# Patient Record
Sex: Male | Born: 1960 | Race: White | Hispanic: No | Marital: Married | Smoking: Current every day smoker
Health system: Southern US, Community
[De-identification: ages and names within clinical notes are randomized; demographics above are authoritative.]

## PROBLEM LIST (undated history)

## (undated) DIAGNOSIS — I499 Cardiac arrhythmia, unspecified: Secondary | ICD-10-CM

## (undated) DIAGNOSIS — F32A Depression, unspecified: Secondary | ICD-10-CM

## (undated) DIAGNOSIS — F329 Major depressive disorder, single episode, unspecified: Secondary | ICD-10-CM

## (undated) DIAGNOSIS — N400 Enlarged prostate without lower urinary tract symptoms: Secondary | ICD-10-CM

## (undated) DIAGNOSIS — J189 Pneumonia, unspecified organism: Secondary | ICD-10-CM

## (undated) DIAGNOSIS — J40 Bronchitis, not specified as acute or chronic: Secondary | ICD-10-CM

## (undated) DIAGNOSIS — G459 Transient cerebral ischemic attack, unspecified: Secondary | ICD-10-CM

## (undated) DIAGNOSIS — G8929 Other chronic pain: Secondary | ICD-10-CM

## (undated) DIAGNOSIS — M797 Fibromyalgia: Secondary | ICD-10-CM

## (undated) DIAGNOSIS — G4730 Sleep apnea, unspecified: Secondary | ICD-10-CM

## (undated) DIAGNOSIS — J449 Chronic obstructive pulmonary disease, unspecified: Secondary | ICD-10-CM

## (undated) DIAGNOSIS — E079 Disorder of thyroid, unspecified: Secondary | ICD-10-CM

## (undated) DIAGNOSIS — J45909 Unspecified asthma, uncomplicated: Secondary | ICD-10-CM

## (undated) DIAGNOSIS — M549 Dorsalgia, unspecified: Secondary | ICD-10-CM

## (undated) DIAGNOSIS — R51 Headache: Secondary | ICD-10-CM

## (undated) DIAGNOSIS — K219 Gastro-esophageal reflux disease without esophagitis: Secondary | ICD-10-CM

## (undated) DIAGNOSIS — I10 Essential (primary) hypertension: Secondary | ICD-10-CM

## (undated) DIAGNOSIS — R0602 Shortness of breath: Secondary | ICD-10-CM

## (undated) DIAGNOSIS — E039 Hypothyroidism, unspecified: Secondary | ICD-10-CM

## (undated) DIAGNOSIS — I639 Cerebral infarction, unspecified: Secondary | ICD-10-CM

## (undated) DIAGNOSIS — M1990 Unspecified osteoarthritis, unspecified site: Secondary | ICD-10-CM

## (undated) DIAGNOSIS — N200 Calculus of kidney: Secondary | ICD-10-CM

## (undated) HISTORY — PX: PATENT FORAMEN OVALE CLOSURE: SHX2181

## (undated) HISTORY — PX: RADIAL HEAD EXCISION: SHX757

## (undated) HISTORY — PX: FACIAL RECONSTRUCTION SURGERY: SHX631

## (undated) HISTORY — PX: CHOLECYSTECTOMY: SHX55

## (undated) HISTORY — PX: DUPUYTREN / PALMAR FASCIOTOMY: SUR601

---

## 2011-07-02 ENCOUNTER — Encounter: Payer: Self-pay | Admitting: Student

## 2011-07-02 ENCOUNTER — Emergency Department (INDEPENDENT_AMBULATORY_CARE_PROVIDER_SITE_OTHER): Payer: Managed Care, Other (non HMO)

## 2011-07-02 ENCOUNTER — Emergency Department (HOSPITAL_BASED_OUTPATIENT_CLINIC_OR_DEPARTMENT_OTHER)
Admission: EM | Admit: 2011-07-02 | Discharge: 2011-07-02 | Disposition: A | Discharge status: Home / Self Care | Payer: Managed Care, Other (non HMO) | Attending: Emergency Medicine | Admitting: Emergency Medicine

## 2011-07-02 DIAGNOSIS — IMO0002 Reserved for concepts with insufficient information to code with codable children: Secondary | ICD-10-CM | POA: Insufficient documentation

## 2011-07-02 DIAGNOSIS — G8929 Other chronic pain: Secondary | ICD-10-CM | POA: Insufficient documentation

## 2011-07-02 DIAGNOSIS — Z8679 Personal history of other diseases of the circulatory system: Secondary | ICD-10-CM | POA: Insufficient documentation

## 2011-07-02 DIAGNOSIS — N508 Other specified disorders of male genital organs: Secondary | ICD-10-CM

## 2011-07-02 DIAGNOSIS — IMO0001 Reserved for inherently not codable concepts without codable children: Secondary | ICD-10-CM | POA: Insufficient documentation

## 2011-07-02 DIAGNOSIS — D1779 Benign lipomatous neoplasm of other sites: Secondary | ICD-10-CM | POA: Insufficient documentation

## 2011-07-02 DIAGNOSIS — N509 Disorder of male genital organs, unspecified: Secondary | ICD-10-CM | POA: Insufficient documentation

## 2011-07-02 DIAGNOSIS — X500XXA Overexertion from strenuous movement or load, initial encounter: Secondary | ICD-10-CM | POA: Insufficient documentation

## 2011-07-02 DIAGNOSIS — J45909 Unspecified asthma, uncomplicated: Secondary | ICD-10-CM | POA: Insufficient documentation

## 2011-07-02 DIAGNOSIS — R1032 Left lower quadrant pain: Secondary | ICD-10-CM | POA: Insufficient documentation

## 2011-07-02 DIAGNOSIS — R109 Unspecified abdominal pain: Secondary | ICD-10-CM

## 2011-07-02 DIAGNOSIS — K219 Gastro-esophageal reflux disease without esophagitis: Secondary | ICD-10-CM | POA: Insufficient documentation

## 2011-07-02 DIAGNOSIS — E119 Type 2 diabetes mellitus without complications: Secondary | ICD-10-CM | POA: Insufficient documentation

## 2011-07-02 DIAGNOSIS — S76219A Strain of adductor muscle, fascia and tendon of unspecified thigh, initial encounter: Secondary | ICD-10-CM

## 2011-07-02 HISTORY — DX: Cerebral infarction, unspecified: I63.9

## 2011-07-02 HISTORY — DX: Transient cerebral ischemic attack, unspecified: G45.9

## 2011-07-02 HISTORY — DX: Fibromyalgia: M79.7

## 2011-07-02 HISTORY — DX: Gastro-esophageal reflux disease without esophagitis: K21.9

## 2011-07-02 HISTORY — DX: Essential (primary) hypertension: I10

## 2011-07-02 HISTORY — DX: Benign prostatic hyperplasia without lower urinary tract symptoms: N40.0

## 2011-07-02 HISTORY — DX: Other chronic pain: G89.29

## 2011-07-02 HISTORY — DX: Dorsalgia, unspecified: M54.9

## 2011-07-02 LAB — URINALYSIS, ROUTINE W REFLEX MICROSCOPIC
Glucose, UA: NEGATIVE mg/dL
Ketones, ur: 15 mg/dL — AB
Leukocytes, UA: NEGATIVE
Nitrite: NEGATIVE
Protein, ur: NEGATIVE mg/dL
pH: 5 (ref 5.0–8.0)

## 2011-07-02 NOTE — ED Notes (Signed)
MD at bedside. 

## 2011-07-02 NOTE — ED Notes (Signed)
Pt in with c/o left lower abd pain radiating from groin and testicle to LLQ. Reports swelling to left groin, nausea, and burning sensation from left testicle to left groin.

## 2011-07-02 NOTE — ED Provider Notes (Signed)
History     CSN: [id number] Arrival date & time: 07/02/2011 11:46 AM  Chief Complaint  Patient presents with  . Abdominal Pain   HPI Comments: 3 days of left groin pain after lifting a heavy object on Friday. Pain immediately started after lifting and has been persistent and worsening. Pain radiates into his left testicle and left lower abdomen. Symptoms are associated with nausea but no vomiting no fever no urinary symptoms. Patient takes morphine at home for chronic back pain which she says is not affecting his groin pain at all.  No bowel movement x 3 days but normally regular.  No fever, chills, bowel or bladder incontinence.  No weakness, numbness, tingling in extremities.  No upper abdominal pain.  No chest pain, SOB, cough.  The history is provided by the patient.    Past Medical History  Diagnosis Date  . Asthma   . Diabetes mellitus   . Hypertension   . Stroke   . TIA (transient ischemic attack)   . Chronic back pain   . Fibromyalgia   . BPH (benign prostatic hyperplasia)   . GERD (gastroesophageal reflux disease)   . Low testosterone     Past Surgical History  Procedure Date  . Cholecystectomy   . Facial reconstruction surgery   . Patent foramen ovale closure   . Dupuytren / palmar fasciotomy     History reviewed. No pertinent family history.  History  Substance Use Topics  . Smoking status: Current Everyday Smoker  . Smokeless tobacco: Never Used  . Alcohol Use: No      Review of Systems  Constitutional: Negative for activity change and appetite change.  HENT: Negative for congestion and rhinorrhea.   Respiratory: Negative for cough, chest tightness and shortness of breath.   Cardiovascular: Negative for chest pain.  Gastrointestinal: Positive for nausea and abdominal pain. Negative for vomiting.  Genitourinary: Positive for scrotal swelling and testicular pain. Negative for dysuria, hematuria, flank pain and difficulty urinating.  Musculoskeletal:  Positive for back pain.  Neurological: Negative for dizziness, weakness and headaches.    Physical Exam  BP 144/93  Pulse 99  Temp(Src) 98.8 F (37.1 C) (Axillary)  Resp 20  Wt 200 lb (90.719 kg)  SpO2 99%  Physical Exam  Constitutional: He is oriented to person, place, and time. He appears well-developed and well-nourished. No distress.  HENT:  Head: Normocephalic and atraumatic.  Mouth/Throat: No oropharyngeal exudate.  Eyes: Conjunctivae are normal. Pupils are equal, round, and reactive to light.  Neck: Normal range of motion.  Cardiovascular: Normal rate, regular rhythm and normal heart sounds.   Pulmonary/Chest: Effort normal and breath sounds normal. No respiratory distress.  Abdominal: Soft. There is no tenderness. There is no rebound and no guarding.       LLQ soft and nontender  Genitourinary: Penis normal.       TTP L testicle and epididymis.  No appreciable hernias but pain on exam.  TTP L inguinal region without obvious hernia.  Musculoskeletal: Normal range of motion.  Neurological: He is alert and oriented to person, place, and time. No cranial nerve deficit.  Skin: Skin is warm.    ED Course  Procedures  MDM Groin and testicle pain after lifting associated with nausea.  Concern for hernia, though non appreciable on exam. Will ultrasound testicle to evaluate, UA.  Normal testicular ultrasound, no hernias seen on or CT.  Patient given groin strain instructions and continue to take chronic pain meds.  Results for orders  placed during the hospital encounter of 07/02/11  URINALYSIS, ROUTINE W REFLEX MICROSCOPIC      Component Value Range   Color, Urine AMBER (*) YELLOW    Appearance CLEAR  CLEAR    Specific Gravity, Urine 1.023  1.005 - 1.030    pH 5.0  5.0 - 8.0    Glucose, UA NEGATIVE  NEGATIVE (mg/dL)   Hgb urine dipstick NEGATIVE  NEGATIVE    Bilirubin Urine SMALL (*) NEGATIVE    Ketones, ur 15 (*) NEGATIVE (mg/dL)   Protein, ur NEGATIVE  NEGATIVE  (mg/dL)   Urobilinogen, UA 0.2  0.0 - 1.0 (mg/dL)   Nitrite NEGATIVE  NEGATIVE    Leukocytes, UA NEGATIVE  NEGATIVE    Ct Pelvis Wo Contrast  07/02/2011  *RADIOLOGY REPORT*  Clinical Data:  Left groin pain.  Query hernia.  CT PELVIS WITHOUT CONTRAST  Technique:  Multidetector CT imaging of the pelvis was performed following the standard protocol without intravenous contrast.  Comparison:  None.  Findings:  Postoperative findings noted along the right iliac crest.  No groin hernia or significant abnormality along the spermatic cords identified.  No pelvic adenopathy is noted.  The appendix appears normal.  The pubic symphysis appears unremarkable.  No hip effusion is observed.  Incidental note is made of a lipoma in the left biceps femoris muscle proximally, measuring approximately 3.8 x 1.3 x 1.4 cm.  A small focus of popcorn type calcification without significant soft tissue lesion measuring 1.3 x 0.9 x 0.7 cm is present above the right sided urinary bladder on image 31 of series 3.  IMPRESSION:  1.  No hernia identified. 2.  Postoperative findings along the right iliac crest. 3.  Small focus of popcorn type calcification in the mesentery above the right sided urinary bladder probably represents a calcified lymph node related to remote infection, or a focus of prior fat necrosis in this vicinity.  Given the morphology, character, and location of this structure, a neoplastic cause is considered highly unlikely. 4.  Left biceps femoris muscle lipoma.  Original Report Authenticated By: Dellia Cloud, M.D.   US Scrotum  07/02/2011  *RADIOLOGY REPORT*  Clinical Data:  Left testicle and groin pain for 3 days status post heavy lifting.  SCROTAL ULTRASOUND DOPPLER ULTRASOUND OF THE TESTICLES  Technique: Complete ultrasound examination of the testicles, epididymis, and other scrotal structures was performed.  Color and spectral Doppler ultrasound were also utilized to evaluate blood flow to the testicles.   Comparison:  None  Findings:  Right testis:  The right testis is normal in echogenicity.  It is normal in size, measuring 4.1 x 1.9 x 2.7 cm.  There is no mass or evidence of microlithiasis.  Normal color Doppler flow is identified to the right testis.  Arterial and venous waveforms are seen in the right testis.  Left testis:  The left testis is normal in echogenicity.  The left testis is normal in size, measuring 3.7 x 1.7 x 2.0 cm.  There is no mass or evidence of microlithiasis.  Normal and symmetric color Doppler flow, compared to the right testis, is identified. Arterial venous venous waveforms are seen.  Right epididymis:  Normal in size and appearance.  Left epididymis:  Contains a 5 mm cyst versus spermatocele.  Hydocele:  present/absent.  Varicocele:  present/absent.  Pulsed Doppler interrogation of both testes demonstrates low resistance flow bilaterally.  No evidence of hernia in the groin bilaterally.  IMPRESSION: 1. Normal appearance of both testes.  No evidence of testicular mass or torsion.  2.  5 mm spermatocele or epididymal cyst on the left.  Original Report Authenticated By: Britta Mccreedy, M.D.   [place] Art/ven Flow Abd Pelv Doppler  07/02/2011  *RADIOLOGY REPORT*  Clinical Data:  Left testicle and groin pain for 3 days status post heavy lifting.  SCROTAL ULTRASOUND DOPPLER ULTRASOUND OF THE TESTICLES  Technique: Complete ultrasound examination of the testicles, epididymis, and other scrotal structures was performed.  Color and spectral Doppler ultrasound were also utilized to evaluate blood flow to the testicles.  Comparison:  None  Findings:  Right testis:  The right testis is normal in echogenicity.  It is normal in size, measuring 4.1 x 1.9 x 2.7 cm.  There is no mass or evidence of microlithiasis.  Normal color Doppler flow is identified to the right testis.  Arterial and venous waveforms are seen in the right testis.  Left testis:  The left testis is normal in echogenicity.  The left testis is  normal in size, measuring 3.7 x 1.7 x 2.0 cm.  There is no mass or evidence of microlithiasis.  Normal and symmetric color Doppler flow, compared to the right testis, is identified. Arterial venous venous waveforms are seen.  Right epididymis:  Normal in size and appearance.  Left epididymis:  Contains a 5 mm cyst versus spermatocele.  Hydocele:  present/absent.  Varicocele:  present/absent.  Pulsed Doppler interrogation of both testes demonstrates low resistance flow bilaterally.  No evidence of hernia in the groin bilaterally.  IMPRESSION: 1. Normal appearance of both testes.  No evidence of testicular mass or torsion.  2.  5 mm spermatocele or epididymal cyst on the left.  Original Report Authenticated By: Britta Mccreedy, M.D.        Glynn Octave, MD 07/02/11 [phone]

## 2011-08-13 ENCOUNTER — Encounter (HOSPITAL_BASED_OUTPATIENT_CLINIC_OR_DEPARTMENT_OTHER): Payer: Self-pay | Admitting: *Deleted

## 2011-08-13 ENCOUNTER — Emergency Department (HOSPITAL_BASED_OUTPATIENT_CLINIC_OR_DEPARTMENT_OTHER)
Admission: EM | Admit: 2011-08-13 | Discharge: 2011-08-13 | Disposition: A | Discharge status: Home / Self Care | Payer: Managed Care, Other (non HMO) | Attending: Emergency Medicine | Admitting: Emergency Medicine

## 2011-08-13 DIAGNOSIS — E119 Type 2 diabetes mellitus without complications: Secondary | ICD-10-CM | POA: Insufficient documentation

## 2011-08-13 DIAGNOSIS — K0889 Other specified disorders of teeth and supporting structures: Secondary | ICD-10-CM

## 2011-08-13 DIAGNOSIS — R220 Localized swelling, mass and lump, head: Secondary | ICD-10-CM | POA: Insufficient documentation

## 2011-08-13 DIAGNOSIS — G8929 Other chronic pain: Secondary | ICD-10-CM | POA: Insufficient documentation

## 2011-08-13 DIAGNOSIS — K219 Gastro-esophageal reflux disease without esophagitis: Secondary | ICD-10-CM | POA: Insufficient documentation

## 2011-08-13 DIAGNOSIS — R221 Localized swelling, mass and lump, neck: Secondary | ICD-10-CM | POA: Insufficient documentation

## 2011-08-13 DIAGNOSIS — R599 Enlarged lymph nodes, unspecified: Secondary | ICD-10-CM | POA: Insufficient documentation

## 2011-08-13 DIAGNOSIS — I10 Essential (primary) hypertension: Secondary | ICD-10-CM | POA: Insufficient documentation

## 2011-08-13 DIAGNOSIS — Z8679 Personal history of other diseases of the circulatory system: Secondary | ICD-10-CM | POA: Insufficient documentation

## 2011-08-13 DIAGNOSIS — K089 Disorder of teeth and supporting structures, unspecified: Secondary | ICD-10-CM | POA: Insufficient documentation

## 2011-08-13 DIAGNOSIS — IMO0001 Reserved for inherently not codable concepts without codable children: Secondary | ICD-10-CM | POA: Insufficient documentation

## 2011-08-13 MED ORDER — CLINDAMYCIN HCL 150 MG PO CAPS: 150.0000 mg | ORAL_CAPSULE | Freq: Four times a day (QID) | ORAL | Status: AC
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
## 2011-08-13 NOTE — ED Provider Notes (Signed)
History     CSN: [id number] Arrival date & time: 08/13/2011  4:50 AM  Chief Complaint  Patient presents with  . Dental Problem    (Consider location/radiation/quality/duration/timing/severity/associated sxs/prior treatment) Patient is a 50 y.o. male presenting with tooth pain. The history is provided by the patient.  Dental PainThe primary symptoms include mouth pain and dental injury. Primary symptoms do not include oral bleeding, headaches, fever, shortness of breath or angioedema. The symptoms began 1 to 2 hours ago. The symptoms are unchanged. The symptoms occur constantly.  Additional symptoms include: gum tenderness and facial swelling. Additional symptoms do not include: trismus, trouble swallowing and pain with swallowing.   HX OF FREQUENT AND RECURRENT DENTAL PROBLEMS. ALLERGIC TO PCN, CLINDA WORKS BEST. DOES NOT WANT PAIN MEDS SINCE FOLLOWED BY PAIN MANAGEMENT.   Past Medical History  Diagnosis Date  . Diabetes mellitus   . Hypertension   . Stroke   . TIA (transient ischemic attack)   . Chronic back pain   . Fibromyalgia   . BPH (benign prostatic hyperplasia)   . GERD (gastroesophageal reflux disease)   . Low testosterone     Past Surgical History  Procedure Date  . Cholecystectomy   . Facial reconstruction surgery   . Patent foramen ovale closure   . Dupuytren / palmar fasciotomy     History reviewed. No pertinent family history.  History  Substance Use Topics  . Smoking status: Current Everyday Smoker  . Smokeless tobacco: Never Used  . Alcohol Use: No      Review of Systems  Constitutional: Negative for fever and chills.  HENT: Positive for facial swelling. Negative for trouble swallowing.   Respiratory: Negative for shortness of breath.   Genitourinary: Negative for hematuria.  Musculoskeletal: Negative for back pain.  Neurological: Negative for headaches.  Hematological: Positive for adenopathy.    Allergies  Bee; Darvocet; Darvon; and  Penicillins  Home Medications   Current Outpatient Rx  Name Route Sig Dispense Refill  . ALBUTEROL SULFATE (2.5 MG/3ML) 0.083% IN NEBU Nebulization Take 2.5 mg by nebulization every 6 (six) hours as needed.      . ATENOLOL 50 MG PO TABS Oral Take 50 mg by mouth daily.      Marland Kitchen CLINDAMYCIN HCL 150 MG PO CAPS Oral Take 1 capsule (150 mg total) by mouth every 6 (six) hours. 28 capsule 0  . EPINEPHRINE 0.3 MG/0.3ML IJ DEVI Intramuscular Inject 0.3 mg into the muscle as needed.      Marland Kitchen GABAPENTIN 800 MG PO TABS Oral Take 800 mg by mouth 3 (three) times daily.      . IPRATROPIUM BROMIDE HFA 17 MCG/ACT IN AERS Inhalation Inhale 2 puffs into the lungs every 6 (six) hours.      Marland Kitchen METAXALONE 800 MG PO TABS Oral Take 800 mg by mouth 3 (three) times daily.      Marland Kitchen METFORMIN HCL 1000 MG (MOD) PO TB24 Oral Take 200 mg by mouth daily with breakfast.      . MORPHINE SULFATE 100 MG PO CP24 Oral Take 100 mg by mouth daily.      . OXYCODONE-ACETAMINOPHEN 10-325 MG PO TABS Oral Take 1 tablet by mouth every 4 (four) hours as needed.      Marland Kitchen PANTOPRAZOLE SODIUM 40 MG PO TBEC Oral Take 40 mg by mouth daily.      Marland Kitchen PROMETHAZINE HCL 25 MG PO TABS Oral Take 25 mg by mouth every 6 (six) hours as needed.      Marland Kitchen  TAMSULOSIN HCL 0.4 MG PO CAPS Oral Take by mouth.      . TESTOSTERONE 2.5 MG/24HR TD PT24 Transdermal Place 1 patch onto the skin daily.      . WARFARIN SODIUM 5 MG PO TABS Oral Take 5 mg by mouth 3 (three) times a week.      . WARFARIN SODIUM 7.5 MG PO TABS Oral Take 7.5 mg by mouth 4 (four) times a week.        BP 136/88  Pulse 90  Temp 98.1 F (36.7 C)  Resp 20  SpO2 98%  Physical Exam  Nursing note and vitals reviewed. Constitutional: He is oriented to person, place, and time. He appears well-developed and well-nourished. No distress.  HENT:  Head: Normocephalic and atraumatic.  Mouth/Throat: Oropharynx is clear and moist.  Eyes: Conjunctivae and EOM are normal. Pupils are equal, round, and reactive  to light.  Neck: Normal range of motion. Neck supple.  Cardiovascular: Normal rate, regular rhythm and normal heart sounds.   No murmur heard. Pulmonary/Chest: Effort normal and breath sounds normal.  Abdominal: Soft. Bowel sounds are normal. There is no tenderness.  Musculoskeletal: Normal range of motion. He exhibits no edema.  Lymphadenopathy:    He has cervical adenopathy.  Neurological: He is alert and oriented to person, place, and time. No cranial nerve deficit.  Skin: No rash noted. No erythema.    ED Course  Procedures (including critical care time)  Labs Reviewed - No data to display No results found.   1. Toothache       MDM   POOR DENTITION AND RIGHT LOWER MOLAR WITH SIG DECAY.         Shelda Jakes, MD 08/13/11 [phone]

## 2011-08-24 ENCOUNTER — Encounter (HOSPITAL_BASED_OUTPATIENT_CLINIC_OR_DEPARTMENT_OTHER): Payer: Self-pay

## 2011-08-24 ENCOUNTER — Emergency Department (HOSPITAL_BASED_OUTPATIENT_CLINIC_OR_DEPARTMENT_OTHER)
Admission: EM | Admit: 2011-08-24 | Discharge: 2011-08-25 | Disposition: A | Discharge status: Home / Self Care | Payer: Managed Care, Other (non HMO) | Attending: Emergency Medicine | Admitting: Emergency Medicine

## 2011-08-24 DIAGNOSIS — G8929 Other chronic pain: Secondary | ICD-10-CM | POA: Insufficient documentation

## 2011-08-24 DIAGNOSIS — Z8679 Personal history of other diseases of the circulatory system: Secondary | ICD-10-CM | POA: Insufficient documentation

## 2011-08-24 DIAGNOSIS — K219 Gastro-esophageal reflux disease without esophagitis: Secondary | ICD-10-CM | POA: Insufficient documentation

## 2011-08-24 DIAGNOSIS — I10 Essential (primary) hypertension: Secondary | ICD-10-CM | POA: Insufficient documentation

## 2011-08-24 DIAGNOSIS — M795 Residual foreign body in soft tissue: Secondary | ICD-10-CM | POA: Insufficient documentation

## 2011-08-24 DIAGNOSIS — Z79899 Other long term (current) drug therapy: Secondary | ICD-10-CM | POA: Insufficient documentation

## 2011-08-24 DIAGNOSIS — E119 Type 2 diabetes mellitus without complications: Secondary | ICD-10-CM | POA: Insufficient documentation

## 2011-08-24 MED ORDER — LIDOCAINE HCL (PF) 1 % IJ SOLN
5.0000 mL | Freq: Once | INTRAMUSCULAR | Status: AC
  Administered 2011-08-25: 5 mL

## 2011-08-24 MED ORDER — LIDOCAINE HCL (PF) 1 % IJ SOLN
INTRAMUSCULAR | Status: AC
  Administered 2011-08-25: 5 mL
  Filled 2011-08-24: qty 5

## 2011-08-24 NOTE — ED Notes (Signed)
Was cutting wood-feel like he has wood in right forearm

## 2011-08-25 NOTE — ED Provider Notes (Signed)
History     CSN: [id number] Arrival date & time: 08/24/2011 11:06 PM   First MD Initiated Contact with Patient 08/24/11 2351      Chief Complaint  Patient presents with  . Arm Injury    (Consider location/radiation/quality/duration/timing/severity/associated sxs/prior treatment) HPI Comments: Pulled out a large piece of the splintered wood but states feels like there is still something in there.  Patient is a 50 y.o. male presenting with arm injury. The history is provided by the patient.  Arm Injury  The incident occurred just prior to arrival. The incident occurred at home. The injury mechanism was a cut/puncture wound. The injury was related to work. No protective equipment was used. There is an injury to the right forearm. The pain is mild. It is suspected that a foreign body is present. Pertinent negatives include no tingling. There have been no prior injuries to these areas. His tetanus status is UTD. He has been behaving normally. He has received no recent medical care.    Past Medical History  Diagnosis Date  . Diabetes mellitus   . Hypertension   . Stroke   . TIA (transient ischemic attack)   . Chronic back pain   . Fibromyalgia   . BPH (benign prostatic hyperplasia)   . GERD (gastroesophageal reflux disease)   . Low testosterone     Past Surgical History  Procedure Date  . Cholecystectomy   . Facial reconstruction surgery   . Patent foramen ovale closure   . Dupuytren / palmar fasciotomy     No family history on file.  History  Substance Use Topics  . Smoking status: Current Everyday Smoker  . Smokeless tobacco: Never Used  . Alcohol Use: No      Review of Systems  Neurological: Negative for tingling.  All other systems reviewed and are negative.    Allergies  Bee; Darvocet; Penicillins; Darvon; and Prednisone  Home Medications   Current Outpatient Rx  Name Route Sig Dispense Refill  . ALBUTEROL SULFATE (2.5 MG/3ML) 0.083% IN NEBU  Nebulization Take 2.5 mg by nebulization every 6 (six) hours as needed. For shortness of breath and wheezing    . ATENOLOL 50 MG PO TABS Oral Take 100 mg by mouth daily.     Marland Kitchen FLUTICASONE-SALMETEROL 250-50 MCG/DOSE IN AEPB Inhalation Inhale 1 puff into the lungs every 12 (twelve) hours.      Marland Kitchen GABAPENTIN 800 MG PO TABS Oral Take 800 mg by mouth 3 (three) times daily.      Marland Kitchen METAXALONE 800 MG PO TABS Oral Take 800 mg by mouth 3 (three) times daily.      . MORPHINE SULFATE 100 MG PO CP24 Oral Take 100 mg by mouth 3 (three) times daily.     Marland Kitchen PANTOPRAZOLE SODIUM 40 MG PO TBEC Oral Take 40 mg by mouth daily.      Marland Kitchen PROMETHAZINE HCL 25 MG PO TABS Oral Take 25 mg by mouth every 6 (six) hours as needed. For nausea     . TAMSULOSIN HCL 0.4 MG PO CAPS Oral Take 0.4 mg by mouth daily after supper.     . WARFARIN SODIUM 5 MG PO TABS Oral Take 5 mg by mouth 4 (four) times a week. Take on Tues, Thurs, Sat and Sun    . WARFARIN SODIUM 7.5 MG PO TABS Oral Take 7.5 mg by mouth 3 (three) times a week. Take on Mon, Wed and Fri    . EPINEPHRINE 0.3 MG/0.3ML IJ DEVI Intramuscular Inject  0.3 mg into the muscle as needed.      . IPRATROPIUM BROMIDE HFA 17 MCG/ACT IN AERS Inhalation Inhale 2 puffs into the lungs every 6 (six) hours.      Marland Kitchen METFORMIN HCL ER (MOD) 1000 MG PO TB24 Oral Take 200 mg by mouth daily with breakfast.      . OXYCODONE-ACETAMINOPHEN 10-325 MG PO TABS Oral Take 1 tablet by mouth every 4 (four) hours as needed.      . TESTOSTERONE 2.5 MG/24HR TD PT24 Transdermal Place 1 patch onto the skin daily.       BP 131/91  Pulse 88  Temp(Src) 98.7 F (37.1 C) (Oral)  Resp 18  Ht 5\' 10"  (1.778 m)  Wt 191 lb (86.637 kg)  BMI 27.41 kg/m2  SpO2 98%  Physical Exam  Nursing note and vitals reviewed. Constitutional: He is oriented to person, place, and time. He appears well-developed and well-nourished. No distress.  HENT:  Head: Normocephalic and atraumatic.  Eyes: EOM are normal. Pupils are equal,  round, and reactive to light.  Musculoskeletal: He exhibits tenderness. He exhibits no edema.  Neurological: He is alert and oriented to person, place, and time.  Skin: Skin is warm and dry. There is erythema.     Psychiatric: He has a normal mood and affect. His behavior is normal. Thought content normal.    ED Course  FOREIGN BODY REMOVAL Date/Time: 08/25/2011 3:59 AM Performed by: Gwyneth Sprout Authorized by: Gwyneth Sprout Consent: Verbal consent obtained. Consent given by: patient Body area: skin Anesthesia: local infiltration Local anesthetic: lidocaine 1% without epinephrine Removal mechanism: scalpel Depth: subcutaneous Complexity: simple Objects recovered: small piece of wood removed Post-procedure assessment: foreign body removed Patient tolerance: Patient tolerated the procedure well with no immediate complications.   (including critical care time)  Labs Reviewed - No data to display No results found.   1. Foreign body (FB) in soft tissue       MDM    Pt with retained splinter to the right forearm which was removed.  Tetanus UTD no signs of infection.       Gwyneth Sprout, MD 08/25/11 [phone]

## 2012-06-30 ENCOUNTER — Other Ambulatory Visit: Payer: Self-pay | Admitting: Orthopedic Surgery

## 2012-06-30 ENCOUNTER — Encounter (HOSPITAL_COMMUNITY): Payer: Self-pay | Admitting: Pharmacy Technician

## 2012-07-01 ENCOUNTER — Encounter (HOSPITAL_COMMUNITY): Payer: Self-pay

## 2012-07-01 MED ORDER — CHLORHEXIDINE GLUCONATE 4 % EX LIQD: 60.0000 mL | Freq: Once | CUTANEOUS | Status: DC

## 2012-07-01 NOTE — Progress Notes (Addendum)
Contacted Dr. Ronie Spies office, spoke with Koleen Nimrod requested orders to be signed off by Dr. Mina Marble for access,. Called Dr. Henrietta Hoover office and requested stress and echo.

## 2012-07-02 ENCOUNTER — Encounter (HOSPITAL_COMMUNITY): Payer: Self-pay | Admitting: *Deleted

## 2012-07-02 ENCOUNTER — Encounter (HOSPITAL_COMMUNITY): Payer: Self-pay | Admitting: Anesthesiology

## 2012-07-02 ENCOUNTER — Ambulatory Visit (HOSPITAL_COMMUNITY): Payer: Medicare Other | Admitting: Anesthesiology

## 2012-07-02 ENCOUNTER — Ambulatory Visit (HOSPITAL_COMMUNITY): Payer: Medicare Other

## 2012-07-02 ENCOUNTER — Encounter (HOSPITAL_COMMUNITY)
Admission: RE | Disposition: A | Discharge status: Home / Self Care | Payer: Self-pay | Source: Ambulatory Visit | Attending: Orthopedic Surgery

## 2012-07-02 ENCOUNTER — Ambulatory Visit (HOSPITAL_COMMUNITY)
Admission: RE | Admit: 2012-07-02 | Discharge: 2012-07-02 | Disposition: A | Discharge status: Home / Self Care | Payer: Medicare Other | Source: Ambulatory Visit | Attending: Orthopedic Surgery | Admitting: Orthopedic Surgery

## 2012-07-02 DIAGNOSIS — E119 Type 2 diabetes mellitus without complications: Secondary | ICD-10-CM | POA: Insufficient documentation

## 2012-07-02 DIAGNOSIS — G4730 Sleep apnea, unspecified: Secondary | ICD-10-CM | POA: Insufficient documentation

## 2012-07-02 DIAGNOSIS — I69998 Other sequelae following unspecified cerebrovascular disease: Secondary | ICD-10-CM | POA: Insufficient documentation

## 2012-07-02 DIAGNOSIS — D472 Monoclonal gammopathy: Secondary | ICD-10-CM | POA: Insufficient documentation

## 2012-07-02 DIAGNOSIS — M24139 Other articular cartilage disorders, unspecified wrist: Secondary | ICD-10-CM | POA: Insufficient documentation

## 2012-07-02 DIAGNOSIS — J449 Chronic obstructive pulmonary disease, unspecified: Secondary | ICD-10-CM | POA: Insufficient documentation

## 2012-07-02 DIAGNOSIS — Z5333 Arthroscopic surgical procedure converted to open procedure: Secondary | ICD-10-CM | POA: Insufficient documentation

## 2012-07-02 DIAGNOSIS — M249 Joint derangement, unspecified: Secondary | ICD-10-CM | POA: Insufficient documentation

## 2012-07-02 DIAGNOSIS — J4489 Other specified chronic obstructive pulmonary disease: Secondary | ICD-10-CM | POA: Insufficient documentation

## 2012-07-02 DIAGNOSIS — K219 Gastro-esophageal reflux disease without esophagitis: Secondary | ICD-10-CM | POA: Insufficient documentation

## 2012-07-02 DIAGNOSIS — M25539 Pain in unspecified wrist: Secondary | ICD-10-CM | POA: Diagnosis present

## 2012-07-02 DIAGNOSIS — I10 Essential (primary) hypertension: Secondary | ICD-10-CM | POA: Insufficient documentation

## 2012-07-02 HISTORY — DX: Bronchitis, not specified as acute or chronic: J40

## 2012-07-02 HISTORY — DX: Headache: R51

## 2012-07-02 HISTORY — DX: Calculus of kidney: N20.0

## 2012-07-02 HISTORY — PX: WRIST ARTHROSCOPY: SHX838

## 2012-07-02 HISTORY — DX: Chronic obstructive pulmonary disease, unspecified: J44.9

## 2012-07-02 HISTORY — DX: Shortness of breath: R06.02

## 2012-07-02 HISTORY — DX: Cardiac arrhythmia, unspecified: I49.9

## 2012-07-02 HISTORY — DX: Pneumonia, unspecified organism: J18.9

## 2012-07-02 HISTORY — DX: Sleep apnea, unspecified: G47.30

## 2012-07-02 HISTORY — DX: Unspecified asthma, uncomplicated: J45.909

## 2012-07-02 HISTORY — DX: Disorder of thyroid, unspecified: E07.9

## 2012-07-02 LAB — CBC
HCT: 47.1 % (ref 39.0–52.0)
Hemoglobin: 16.7 g/dL (ref 13.0–17.0)
MCH: 31.2 pg (ref 26.0–34.0)
MCHC: 35.5 g/dL (ref 30.0–36.0)
MCV: 88 fL (ref 78.0–100.0)
Platelets: 247 10*3/uL (ref 150–400)
RBC: 5.35 MIL/uL (ref 4.22–5.81)
RDW: 12.4 % (ref 11.5–15.5)
WBC: 8.5 10*3/uL (ref 4.0–10.5)

## 2012-07-02 LAB — GLUCOSE, CAPILLARY
Glucose-Capillary: 117 mg/dL — ABNORMAL HIGH (ref 70–99)
Glucose-Capillary: 110 mg/dL — ABNORMAL HIGH (ref 70–99)

## 2012-07-02 LAB — APTT: aPTT: 43 seconds — ABNORMAL HIGH (ref 24–37)

## 2012-07-02 LAB — SURGICAL PCR SCREEN
MRSA, PCR: NEGATIVE
Staphylococcus aureus: NEGATIVE

## 2012-07-02 LAB — PROTIME-INR
INR: 2.91 — ABNORMAL HIGH (ref 0.00–1.49)
Prothrombin Time: 30.9 seconds — ABNORMAL HIGH (ref 11.6–15.2)

## 2012-07-02 LAB — BASIC METABOLIC PANEL
BUN: 9 mg/dL (ref 6–23)
CO2: 29 mEq/L (ref 19–32)
Calcium: 9.6 mg/dL (ref 8.4–10.5)
Chloride: 104 mEq/L (ref 96–112)
Creatinine, Ser: 0.91 mg/dL (ref 0.50–1.35)
GFR calc Af Amer: >90 mL/min (ref >90)
GFR calc non Af Amer: >90 mL/min (ref >90)
Glucose, Bld: 129 mg/dL — ABNORMAL HIGH (ref 70–99)
Potassium: 4.6 mEq/L (ref 3.5–5.1)
Sodium: 140 mEq/L (ref 135–145)

## 2012-07-02 SURGERY — ARTHROSCOPY, WRIST
Anesthesia: General | Site: Wrist | Laterality: Left | Wound class: Clean

## 2012-07-02 MED ORDER — PROPOFOL 10 MG/ML IV BOLUS
INTRAVENOUS | Status: DC | PRN
  Administered 2012-07-02: 120 mg via INTRAVENOUS

## 2012-07-02 MED ORDER — EPHEDRINE SULFATE 50 MG/ML IJ SOLN
INTRAMUSCULAR | Status: DC | PRN
  Administered 2012-07-02 (×4): 10 mg via INTRAVENOUS

## 2012-07-02 MED ORDER — ACETAMINOPHEN 10 MG/ML IV SOLN
INTRAVENOUS | Status: AC
  Filled 2012-07-02: qty 100

## 2012-07-02 MED ORDER — BUPIVACAINE HCL (PF) 0.25 % IJ SOLN
INTRAMUSCULAR | Status: AC
  Filled 2012-07-02: qty 30

## 2012-07-02 MED ORDER — BUPIVACAINE HCL (PF) 0.25 % IJ SOLN
INTRAMUSCULAR | Status: DC | PRN
  Administered 2012-07-02: 30 mL

## 2012-07-02 MED ORDER — MIDAZOLAM HCL 5 MG/5ML IJ SOLN
INTRAMUSCULAR | Status: DC | PRN
  Administered 2012-07-02: 2 mg via INTRAVENOUS

## 2012-07-02 MED ORDER — ONDANSETRON HCL 4 MG/2ML IJ SOLN
INTRAMUSCULAR | Status: AC
  Administered 2012-07-02: 4 mg via INTRAVENOUS
  Filled 2012-07-02: qty 2

## 2012-07-02 MED ORDER — NEOSTIGMINE METHYLSULFATE 1 MG/ML IJ SOLN
INTRAMUSCULAR | Status: DC | PRN
  Administered 2012-07-02: 1.5 mg via INTRAVENOUS

## 2012-07-02 MED ORDER — LIDOCAINE HCL (CARDIAC) 20 MG/ML IV SOLN
INTRAVENOUS | Status: DC | PRN
  Administered 2012-07-02: 50 mg via INTRAVENOUS

## 2012-07-02 MED ORDER — GLYCOPYRROLATE 0.2 MG/ML IJ SOLN
INTRAMUSCULAR | Status: DC | PRN
  Administered 2012-07-02: .2 mg via INTRAVENOUS

## 2012-07-02 MED ORDER — ONDANSETRON HCL 4 MG/2ML IJ SOLN
4.0000 mg | Freq: Once | INTRAMUSCULAR | Status: AC
  Administered 2012-07-02 (×2): 4 mg via INTRAVENOUS

## 2012-07-02 MED ORDER — ROCURONIUM BROMIDE 100 MG/10ML IV SOLN
INTRAVENOUS | Status: DC | PRN
  Administered 2012-07-02: 10 mg via INTRAVENOUS
  Administered 2012-07-02: 30 mg via INTRAVENOUS

## 2012-07-02 MED ORDER — VANCOMYCIN HCL 1000 MG IV SOLR
1000.0000 mg | INTRAVENOUS | Status: DC | PRN
  Administered 2012-07-02: 1000 mg via INTRAVENOUS

## 2012-07-02 MED ORDER — PROMETHAZINE HCL 25 MG/ML IJ SOLN: 6.2500 mg | Freq: Once | INTRAMUSCULAR | Status: DC

## 2012-07-02 MED ORDER — HYDROMORPHONE HCL PF 1 MG/ML IJ SOLN
0.2500 mg | INTRAMUSCULAR | Status: DC | PRN
  Administered 2012-07-02 (×2): 0.25 mg via INTRAVENOUS

## 2012-07-02 MED ORDER — PHENYLEPHRINE HCL 10 MG/ML IJ SOLN
INTRAMUSCULAR | Status: DC | PRN
  Administered 2012-07-02: 160 ug via INTRAVENOUS
  Administered 2012-07-02 (×2): 80 ug via INTRAVENOUS
  Administered 2012-07-02: 40 ug via INTRAVENOUS

## 2012-07-02 MED ORDER — ONDANSETRON HCL 4 MG/2ML IJ SOLN: 4.0000 mg | Freq: Once | INTRAMUSCULAR | Status: DC

## 2012-07-02 MED ORDER — ALBUMIN HUMAN 5 % IV SOLN
INTRAVENOUS | Status: DC | PRN
  Administered 2012-07-02: 13:00:00 via INTRAVENOUS

## 2012-07-02 MED ORDER — EPHEDRINE SULFATE 50 MG/ML IJ SOLN: INTRAMUSCULAR | Status: DC | PRN

## 2012-07-02 MED ORDER — OXYCODONE HCL 5 MG/5ML PO SOLN: 5.0000 mg | Freq: Once | ORAL | Status: DC | PRN

## 2012-07-02 MED ORDER — SUCCINYLCHOLINE CHLORIDE 20 MG/ML IJ SOLN
INTRAMUSCULAR | Status: DC | PRN
  Administered 2012-07-02: 120 mg via INTRAVENOUS

## 2012-07-02 MED ORDER — SODIUM CHLORIDE 0.9 % IR SOLN
Status: DC | PRN
  Administered 2012-07-02: 1000 mL

## 2012-07-02 MED ORDER — HYDROMORPHONE HCL PF 1 MG/ML IJ SOLN
INTRAMUSCULAR | Status: AC
  Filled 2012-07-02: qty 1

## 2012-07-02 MED ORDER — TRIAMCINOLONE ACETONIDE 40 MG/ML IJ SUSP
INTRAMUSCULAR | Status: AC
  Filled 2012-07-02: qty 1

## 2012-07-02 MED ORDER — ACETAMINOPHEN 10 MG/ML IV SOLN
1000.0000 mg | Freq: Once | INTRAVENOUS | Status: AC
  Administered 2012-07-02 (×2): 1000 mg via INTRAVENOUS

## 2012-07-02 MED ORDER — VANCOMYCIN HCL IN DEXTROSE 1-5 GM/200ML-% IV SOLN
INTRAVENOUS | Status: AC
  Filled 2012-07-02: qty 200

## 2012-07-02 MED ORDER — MUPIROCIN 2 % EX OINT
TOPICAL_OINTMENT | Freq: Once | CUTANEOUS | Status: AC
  Administered 2012-07-02: 11:00:00 via NASAL
  Filled 2012-07-02: qty 22

## 2012-07-02 MED ORDER — OXYCODONE HCL 5 MG PO TABS: 5.0000 mg | ORAL_TABLET | Freq: Once | ORAL | Status: DC | PRN

## 2012-07-02 MED ORDER — LIDOCAINE HCL 4 % MT SOLN
OROMUCOSAL | Status: DC | PRN
  Administered 2012-07-02: 4 mL via TOPICAL

## 2012-07-02 MED ORDER — LACTATED RINGERS IV SOLN
INTRAVENOUS | Status: DC | PRN
  Administered 2012-07-02: 13:00:00 via INTRAVENOUS

## 2012-07-02 MED ORDER — OXYCODONE-ACETAMINOPHEN 5-325 MG PO TABS: 1.0000 | ORAL_TABLET | ORAL | Status: AC | PRN

## 2012-07-02 MED ORDER — LACTATED RINGERS IV SOLN: INTRAVENOUS | Status: DC

## 2012-07-02 MED ORDER — FENTANYL CITRATE 0.05 MG/ML IJ SOLN
INTRAMUSCULAR | Status: DC | PRN
  Administered 2012-07-02: 150 ug via INTRAVENOUS

## 2012-07-02 MED ORDER — DEXAMETHASONE SODIUM PHOSPHATE 4 MG/ML IJ SOLN
INTRAMUSCULAR | Status: DC | PRN
  Administered 2012-07-02: 10 mg via INTRAVENOUS

## 2012-07-02 SURGICAL SUPPLY — 49 items
6 Hole Ulna Plate ×2 IMPLANT
BANDAGE ELASTIC 3 VELCRO ST LF (GAUZE/BANDAGES/DRESSINGS) ×2 IMPLANT
BANDAGE ELASTIC 4 VELCRO ST LF (GAUZE/BANDAGES/DRESSINGS) ×2 IMPLANT
BANDAGE GAUZE ELAST BULKY 4 IN (GAUZE/BANDAGES/DRESSINGS) ×2 IMPLANT
BNDG CMPR 9X4 STRL LF SNTH (GAUZE/BANDAGES/DRESSINGS) ×1
BNDG ESMARK 4X9 LF (GAUZE/BANDAGES/DRESSINGS) ×2 IMPLANT
BUR CUDA 2.9 (BURR) ×2 IMPLANT
BUR FULL RADIUS 2.9 (BURR) ×2 IMPLANT
BUR GATOR 2.9 (BURR) IMPLANT
BUR SPHERICAL 2.9 (BURR) ×2 IMPLANT
CLOTH BEACON ORANGE TIMEOUT ST (SAFETY) ×2 IMPLANT
CORDS BIPOLAR (ELECTRODE) IMPLANT
COVER SURGICAL LIGHT HANDLE (MISCELLANEOUS) ×2 IMPLANT
CUFF TOURNIQUET SINGLE 18IN (TOURNIQUET CUFF) IMPLANT
CUFF TOURNIQUET SINGLE 24IN (TOURNIQUET CUFF) IMPLANT
DRAPE EXTREMITY T 121X128X90 (DRAPE) IMPLANT
DRAPE PROXIMA HALF (DRAPES) IMPLANT
DRAPE SURG 17X23 STRL (DRAPES) ×2 IMPLANT
GAUZE XEROFORM 1X8 LF (GAUZE/BANDAGES/DRESSINGS) ×2 IMPLANT
GLOVE BIO SURGEON STRL SZ8.5 (GLOVE) ×2 IMPLANT
GOWN PREVENTION PLUS XXLARGE (GOWN DISPOSABLE) ×2 IMPLANT
GOWN SRG XL XLNG 56XLVL 4 (GOWN DISPOSABLE) ×1 IMPLANT
GOWN STRL NON-REIN LRG LVL3 (GOWN DISPOSABLE) ×2 IMPLANT
GOWN STRL NON-REIN XL XLG LVL4 (GOWN DISPOSABLE) ×2
KIT BASIN OR (CUSTOM PROCEDURE TRAY) ×2 IMPLANT
KIT ROOM TURNOVER OR (KITS) ×2 IMPLANT
MANIFOLD NEPTUNE II (INSTRUMENTS) ×2 IMPLANT
NEEDLE 18GX1X1/2 (RX/OR ONLY) (NEEDLE) ×2 IMPLANT
NEEDLE 22X1 1/2 (OR ONLY) (NEEDLE) ×2 IMPLANT
NEEDLE HYPO 21X1 ECLIPSE (NEEDLE) ×2 IMPLANT
NEEDLE HYPO 25GX1X1/2 BEV (NEEDLE) IMPLANT
PACK ORTHO EXTREMITY (CUSTOM PROCEDURE TRAY) ×2 IMPLANT
PAD ARMBOARD 7.5X6 YLW CONV (MISCELLANEOUS) ×4 IMPLANT
PAD CAST 4YDX4 CTTN HI CHSV (CAST SUPPLIES) ×1 IMPLANT
PADDING CAST COTTON 4X4 STRL (CAST SUPPLIES) ×2
SCREW BN 20X3.5XHEXNS CORT (Screw) ×1 IMPLANT
SCREW CORT 3.5X16 (Screw) ×2 IMPLANT
SCREW CORT 3.5X20 (Screw) ×2 IMPLANT
SET SM JOINT TUBING/CANN (CANNULA) ×2 IMPLANT
SPONGE GAUZE 4X4 12PLY (GAUZE/BANDAGES/DRESSINGS) ×2 IMPLANT
STOCKINETTE 4X48 STRL (DRAPES) IMPLANT
SUT ETHILON 5 0 PS 2 18 (SUTURE) IMPLANT
SUT VICRYL RAPIDE 4/0 PS 2 (SUTURE) IMPLANT
TOWEL OR 17X24 6PK STRL BLUE (TOWEL DISPOSABLE) ×2 IMPLANT
TOWEL OR 17X26 10 PK STRL BLUE (TOWEL DISPOSABLE) ×2 IMPLANT
TRAP DIGIT (INSTRUMENTS) ×2 IMPLANT
TUBING EXTENTION W/L.L. (IV SETS) ×2 IMPLANT
UNDERPAD 30X30 INCONTINENT (UNDERPADS AND DIAPERS) ×2 IMPLANT
WATER STERILE IRR 1000ML POUR (IV SOLUTION) ×2 IMPLANT

## 2012-07-02 NOTE — Anesthesia Postprocedure Evaluation (Signed)
  Anesthesia Post-op Note  Patient: Dan Nguyen  Procedure(s) Performed: Procedure(s) (LRB): ARTHROSCOPY WRIST (Left)  Patient Location: PACU  Anesthesia Type: General  Level of Consciousness: awake, alert  and oriented  Airway and Oxygen Therapy: Patient Spontanous Breathing and Patient connected to nasal cannula oxygen  Post-op Pain: mild  Post-op Assessment: Post-op Vital signs reviewed  Post-op Vital Signs: Reviewed  Complications: No apparent anesthesia complications

## 2012-07-02 NOTE — Preoperative (Signed)
Beta Blockers   Reason not to administer Beta Blockers:Not Applicable 

## 2012-07-02 NOTE — H&P (Signed)
Dan Nguyen is an 51 y.o. male.   Chief Complaint: left wrist pain HPI: as above with radial head excision in past now with ulnar wrist pain  Past Medical History  Diagnosis Date  . Diabetes mellitus   . Hypertension   . Stroke   . TIA (transient ischemic attack)     sees Dr. Henrietta Hoover with [facility] cardiology  . Chronic back pain   . BPH (benign prostatic hyperplasia)   . GERD (gastroesophageal reflux disease)   . Low testosterone   . Thyroid dysfunction   . COPD (chronic obstructive pulmonary disease)   . Asthma   . Shortness of breath   . Bronchitis     hx of  . Pneumonia     hx of  . Sleep apnea     does not have a cpap, sees Dr. Su Monks, [facility] pulmonology  . Dysrhythmia     hx of a-fib  . Kidney stone     hx of  . Headache     migraines  . Fibromyalgia     small fiber neuropathy    Past Surgical History  Procedure Date  . Cholecystectomy   . Facial reconstruction surgery   . Patent foramen ovale closure   . Dupuytren / palmar fasciotomy   . Radial head excision     History reviewed. No pertinent family history. Social History:  reports that he quit smoking about 4 weeks ago. His smoking use included Cigarettes. He has a 60 pack-year smoking history. He has never used smokeless tobacco. He reports that he does not drink alcohol or use illicit drugs.  Allergies:  Allergies  Allergen Reactions  . Beesix (Pyridoxine) Anaphylaxis  . Darvocet (Propoxyphene-Acetaminophen)     respiratory failure  . Penicillins     respiratory failure  . Darvon   . Prednisone     Becomes very aggressive     Medications Prior to Admission  Medication Sig Dispense Refill  . albuterol (PROVENTIL HFA;VENTOLIN HFA) 108 (90 BASE) MCG/ACT inhaler Inhale 2 puffs into the lungs every 4 (four) hours as needed. Shortness of breath      . albuterol (PROVENTIL) (2.5 MG/3ML) 0.083% nebulizer solution Take 2.5 mg by nebulization every 6 (six) hours as needed. For shortness of breath  and wheezing      . aspirin EC 81 MG tablet Take 81 mg by mouth daily.      Marland Kitchen atenolol (TENORMIN) 50 MG tablet Take 50 mg by mouth daily.       . budesonide-formoterol (SYMBICORT) 80-4.5 MCG/ACT inhaler Inhale 2 puffs into the lungs 2 (two) times daily.      . Cholecalciferol (VITAMIN D3) 5000 UNITS CAPS Take 1 tablet by mouth 2 (two) times daily.      . cyclobenzaprine (FLEXERIL) 10 MG tablet Take 10 mg by mouth 3 (three) times daily as needed. 1/2- 1 tablet      . EPINEPHrine (EPI-PEN) 0.3 mg/0.3 mL DEVI Inject 0.3 mg into the muscle as needed. For allergic reactions/anaphylaxis      . ezetimibe (ZETIA) 10 MG tablet Take 10 mg by mouth daily.      . furosemide (LASIX) 20 MG tablet Take 10-20 mg by mouth daily.      Marland Kitchen gabapentin (NEURONTIN) 800 MG tablet Take 800 mg by mouth 2 (two) times daily.       Marland Kitchen levothyroxine (SYNTHROID, LEVOTHROID) 25 MCG tablet Take 25 mcg by mouth daily.      Marland Kitchen losartan (COZAAR) 50 MG  tablet Take 50 mg by mouth daily.      . metaxalone (SKELAXIN) 800 MG tablet Take 800 mg by mouth 3 (three) times daily.       Marland Kitchen morphine (KADIAN) 100 MG 24 hr capsule Take 100 mg by mouth 3 (three) times daily.       Marland Kitchen oxyCODONE-acetaminophen (PERCOCET) 10-325 MG per tablet Take 1 tablet by mouth every 4 (four) hours as needed. For pain      . pantoprazole (PROTONIX) 40 MG tablet Take 40 mg by mouth 2 (two) times daily.       . Tamsulosin HCl (FLOMAX) 0.4 MG CAPS Take 0.4 mg by mouth daily after supper.       . testosterone (ANDRODERM) 2.5 MG/24HR Place 1 patch onto the skin daily.       . TESTOSTERONE CYPIONATE IM Inject 200 mg into the muscle every 14 (fourteen) days.      . varenicline (CHANTIX) 1 MG tablet Take 1 mg by mouth 2 (two) times daily.      Marland Kitchen warfarin (COUMADIN) 5 MG tablet Take 5 mg by mouth 4 (four) times a week. Take on Tues, Thurs, Sat and Sun      . warfarin (COUMADIN) 7.5 MG tablet Take 7.5 mg by mouth 3 (three) times a week. Take on Mon, Wed and Fri      .  metFORMIN (GLUMETZA) 1000 MG (MOD) 24 hr tablet Take 1,000 mg by mouth daily with breakfast.       . promethazine (PHENERGAN) 25 MG tablet Take 25 mg by mouth every 6 (six) hours as needed. For nausea        Results for orders placed during the hospital encounter of 07/02/12 (from the past 48 hour(s))  GLUCOSE, CAPILLARY     Status: Abnormal   Collection Time   07/02/12 10:35 AM      Component Value Range Comment   Glucose-Capillary 117 (*) 70 - 99 mg/dL   BASIC METABOLIC PANEL     Status: Abnormal   Collection Time   07/02/12 10:44 AM      Component Value Range Comment   Sodium 140  135 - 145 mEq/L    Potassium 4.6  3.5 - 5.1 mEq/L HEMOLYSIS AT THIS LEVEL MAY AFFECT RESULT   Chloride 104  96 - 112 mEq/L    CO2 29  19 - 32 mEq/L    Glucose, Bld 129 (*) 70 - 99 mg/dL    BUN 9  6 - 23 mg/dL    Creatinine, Ser 1.61  0.50 - 1.35 mg/dL    Calcium 9.6  8.4 - 09.6 mg/dL    GFR calc non Af Amer >90  >90 mL/min    GFR calc Af Amer >90  >90 mL/min   CBC     Status: Normal   Collection Time   07/02/12 10:44 AM      Component Value Range Comment   WBC 8.5  4.0 - 10.5 K/uL    RBC 5.35  4.22 - 5.81 MIL/uL    Hemoglobin 16.7  13.0 - 17.0 g/dL    HCT 04.5  40.9 - 81.1 %    MCV 88.0  78.0 - 100.0 fL    MCH 31.2  26.0 - 34.0 pg    MCHC 35.5  30.0 - 36.0 g/dL    RDW 91.4  78.2 - 95.6 %    Platelets 247  150 - 400 K/uL   PROTIME-INR     Status: Abnormal  Collection Time   07/02/12 10:44 AM      Component Value Range Comment   Prothrombin Time 30.9 (*) 11.6 - 15.2 seconds    INR 2.91 (*) 0.00 - 1.49   APTT     Status: Abnormal   Collection Time   07/02/12 10:44 AM      Component Value Range Comment   aPTT 43 (*) 24 - 37 seconds    Dg Chest 2 View  07/02/2012  *RADIOLOGY REPORT*  Clinical Data: Preoperative radiograph.  Ulnocarpal abutment.  CHEST - 2 VIEW  Comparison: None.  Findings: There is no airspace disease or effusion.  Metallic densities are present over the posterior aspect of the  cardiopericardial silhouette compatible with device for PFO closure.  There is a nodular density on the frontal view at the left base measuring 1 cm.  Follow-up chest CT recommended.  Pulmonary nodule not excluded.  Lower thoracic compression fractures are present, likely chronic.  IMPRESSION: 1.  No acute cardiopulmonary disease. 2.  10 mm nodular density at the left lung base.  Follow-up noncontrast chest CT recommended to further evaluate. 3.  Metallic densities over the cardiopericardial silhouette compatible with PFO closure device.  These results will be called to the ordering clinician or representative by the Radiologist Assistant, and communication documented in the PACS Dashboard.   Original Report Authenticated By: Andreas Newport, M.D.     Review of Systems  All other systems reviewed and are negative.    Blood pressure 122/85, pulse 101, temperature 98 F (36.7 C), temperature source Oral, resp. rate 18, weight 86.637 kg (191 lb), SpO2 97.00%. Physical Exam  Constitutional: He is oriented to person, place, and time. He appears well-developed and well-nourished.  HENT:  Head: Normocephalic and atraumatic.  Cardiovascular: Normal rate.   Respiratory: Effort normal.  Musculoskeletal:       Left wrist: He exhibits tenderness, bony tenderness and deformity.  Neurological: He is alert and oriented to person, place, and time.  Skin: Skin is warm.  Psychiatric: He has a normal mood and affect. His behavior is normal. Judgment and thought content normal.     Assessment/Plan As above  Plan wrist ascope with debridment amd open ulnar shortening  Myda Detwiler A 07/02/2012, 12:21 PM

## 2012-07-02 NOTE — Anesthesia Preprocedure Evaluation (Signed)
Anesthesia Evaluation  Patient identified by MRN, date of birth, ID band Patient awake    Reviewed: Allergy & Precautions, H&P , NPO status , Patient's Chart, lab work & pertinent test results, reviewed documented beta blocker date and time   Airway Mallampati: II TM Distance: >3 FB Neck ROM: Full    Dental No notable dental hx. (+) Poor Dentition and Dental Advisory Given   Pulmonary shortness of breath and with exertion, asthma , sleep apnea , COPDformer smoker,  breath sounds clear to auscultation  Pulmonary exam normal       Cardiovascular hypertension, On Medications and On Home Beta Blockers + dysrhythmias Atrial Fibrillation Rhythm:Regular Rate:Normal     Neuro/Psych TIACVA, Residual Symptoms negative psych ROS   GI/Hepatic Neg liver ROS, GERD-  Medicated and Controlled,  Endo/Other  Well Controlled, Type 2  Renal/GU Renal diseasenegative Renal ROS  negative genitourinary   Musculoskeletal   Abdominal   Peds  Hematology negative hematology ROS (+)   Anesthesia Other Findings   Reproductive/Obstetrics negative OB ROS                           Anesthesia Physical Anesthesia Plan  ASA: III  Anesthesia Plan: General   Post-op Pain Management:    Induction: Intravenous  Airway Management Planned: Oral ETT  Additional Equipment:   Intra-op Plan:   Post-operative Plan: Extubation in OR  Informed Consent: I have reviewed the patients History and Physical, chart, labs and discussed the procedure including the risks, benefits and alternatives for the proposed anesthesia with the patient or authorized representative who has indicated his/her understanding and acceptance.   Dental advisory given  Plan Discussed with: CRNA  Anesthesia Plan Comments:         Anesthesia Quick Evaluation

## 2012-07-02 NOTE — Brief Op Note (Signed)
07/02/2012  2:35 PM  PATIENT:  Amparo Bristol  51 y.o. male  PRE-OPERATIVE DIAGNOSIS:  left wrist abutment syndrome  POST-OPERATIVE DIAGNOSIS:  left wrist abutment syndrome  PROCEDURE:  Procedure(s) (LRB): ARTHROSCOPY WRIST (Left)  SURGEON:  Surgeon(s) and Role:    * Marlowe Shores, MD - Primary  PHYSICIAN ASSISTANT:   ASSISTANTS: none   ANESTHESIA:   general  EBL:  Total I/O In: 1250 [I.V.:1000; IV Piggyback:250] Out: -   BLOOD ADMINISTERED:none  DRAINS: none   LOCAL MEDICATIONS USED:  MARCAINE   10cc  SPECIMEN:  No Specimen  DISPOSITION OF SPECIMEN:  N/A  COUNTS:  YES  TOURNIQUET:  * Missing tourniquet times found for documented tourniquets in log:  56591 *  DICTATION: .Other Dictation: Dictation Number [id number]  PLAN OF CARE: Discharge to home after PACU  PATIENT DISPOSITION:  PACU - hemodynamically stable.   Delay start of Pharmacological VTE agent (>24hrs) due to surgical blood loss or risk of bleeding: not applicable

## 2012-07-02 NOTE — Op Note (Signed)
See dictated note [phone]

## 2012-07-02 NOTE — Transfer of Care (Signed)
Immediate Anesthesia Transfer of Care Note  Patient: Dan Nguyen  Procedure(s) Performed: Procedure(s) (LRB): ARTHROSCOPY WRIST (Left)  Patient Location: PACU  Anesthesia Type: General  Level of Consciousness: awake, alert  and oriented  Airway & Oxygen Therapy: Patient Spontanous Breathing, Patient connected to nasal cannula oxygen and Patient connected to face mask oxygen  Post-op Assessment: Report given to PACU RN, Post -op Vital signs reviewed and stable and Patient moving all extremities X 4  Post vital signs: Reviewed and stable  Complications: No apparent anesthesia complications

## 2012-07-02 NOTE — Anesthesia Procedure Notes (Signed)
Procedure Name: Intubation Date/Time: 07/02/2012 1:02 PM Performed by: Marena Chancy Pre-anesthesia Checklist: Emergency Drugs available, Patient identified, Timeout performed, Suction available and Patient being monitored Patient Re-evaluated:Patient Re-evaluated prior to inductionOxygen Delivery Method: Circle system utilized Preoxygenation: Pre-oxygenation with 100% oxygen Intubation Type: IV induction Ventilation: Oral airway inserted - appropriate to patient size and Two handed mask ventilation required Laryngoscope Size: Miller and 2 Grade View: Grade II Tube type: Oral Tube size: 7.5 mm Number of attempts: 1 Placement Confirmation: ETT inserted through vocal cords under direct vision,  positive ETCO2 and breath sounds checked- equal and bilateral Secured at: 23 cm Tube secured with: Tape Dental Injury: Teeth and Oropharynx as per pre-operative assessment

## 2012-07-03 NOTE — Op Note (Signed)
Dan Nguyen, Dan Nguyen                ACCOUNT NO.:  [id number]  MEDICAL RECORD NO.:  [id number]  LOCATION:  [facility]                         FACILITY:  [facility]  PHYSICIAN:  Molli Hazard A. Cambridge Deleo, M.D.DATE OF BIRTH:  1961/05/21  DATE OF PROCEDURE:  07/02/2012 DATE OF DISCHARGE:  07/02/2012                              OPERATIVE REPORT   PREOPERATIVE DIAGNOSIS:  Left wrist ulnocarpal abutment syndrome.  POSTOPERATIVE DIAGNOSIS:  Left wrist ulnocarpal abutment syndrome.  PROCEDURE:  Left wrist arthroscopy, debridement of TFCC degenerative tear as well as open left wrist ulnar shortening osteotomy.  SURGEON:  Artist Pais. Mina Marble, M.D.  ASSISTANT:  None.  ANESTHESIA:  General.  No complication.  No drains.  DESCRIPTION OF PROCEDURE:  The patient was taken to the operating suite after induction of adequate general anesthesia.  Left upper extremity was prepped and draped in usual sterile fashion.  An Esmarch was used to exsanguinate the limb.  Tourniquet was inflated to 250 mmHg.  At this point in time, the patient's left upper extremity was padded and placed in a traction tower, 15 pounds countertraction across the radiocarpal joint.  Standard 3-4 arthroscopic portal was established 1 cm distal to the Lister's tubercle.  Skin was incised sharply.  Dissection was carried down into the joint.  The scope was introduced into the joint. Visualization revealed significant tearing complex type of the TFCC.  SL ligament was intact, radiocarpal ligaments were intact along the radial side.  The scope was directed to the ulnar side.  An 18-gauge needle was used to establish 6.2 outflow portal followed by 4.5 working portal under direct vision.  Next, using combination of 2.9 suction shaver, the TFCC tear was debrided down to a stable rim.  All other osteocartilaginous areas were inspected, no other lesions noted.  There was some slight fraying of the LT ligament, this was debrided as  well. Instruments and scopes were removed from the portals.  The patient was then re-prepped and placed fully pronated on the table.  Once this was done, incision was made over the interval between the ECU and FCU over the subcutaneous border of the ulna.  Skin was incised sharply. Dissection was carried down to this interval, it was developed down to the level of the ulna.  Once this was done, the volar aspect of the ulna was exposed.  After this was done using [employer] ulnar shortening kit, an osteotomy was performed of the bone and shortened 5 mm, fixed with the appropriate screws and lag screw as per [employer] ulnar shortening protocol.  Intraoperative fluoroscopy revealed good reduction of the osteotomy site and ulnar neutral to minus variance.  The wound was then thoroughly irrigated, it was closed in layers of 2-0 undyed Vicryl and a 4-0 Vicryl Rapide on the skin.  The patient tolerated the procedure well and went to the recovery room in stable fashion.     Artist Pais Mina Marble, M.D.     MAW/MEDQ  D:  07/02/2012  T:  07/03/2012  Job:  [id number]

## 2012-07-04 ENCOUNTER — Encounter (HOSPITAL_COMMUNITY): Payer: Self-pay | Admitting: Orthopedic Surgery

## 2012-10-25 ENCOUNTER — Encounter (HOSPITAL_BASED_OUTPATIENT_CLINIC_OR_DEPARTMENT_OTHER): Payer: Self-pay | Admitting: *Deleted

## 2012-10-25 ENCOUNTER — Emergency Department (HOSPITAL_BASED_OUTPATIENT_CLINIC_OR_DEPARTMENT_OTHER)
Admission: EM | Admit: 2012-10-25 | Discharge: 2012-10-25 | Disposition: A | Discharge status: Home / Self Care | Payer: Medicare Other | Attending: Emergency Medicine | Admitting: Emergency Medicine

## 2012-10-25 ENCOUNTER — Emergency Department (HOSPITAL_BASED_OUTPATIENT_CLINIC_OR_DEPARTMENT_OTHER): Payer: Medicare Other

## 2012-10-25 DIAGNOSIS — E079 Disorder of thyroid, unspecified: Secondary | ICD-10-CM | POA: Insufficient documentation

## 2012-10-25 DIAGNOSIS — Z8673 Personal history of transient ischemic attack (TIA), and cerebral infarction without residual deficits: Secondary | ICD-10-CM | POA: Insufficient documentation

## 2012-10-25 DIAGNOSIS — J449 Chronic obstructive pulmonary disease, unspecified: Secondary | ICD-10-CM | POA: Insufficient documentation

## 2012-10-25 DIAGNOSIS — S61409A Unspecified open wound of unspecified hand, initial encounter: Secondary | ICD-10-CM | POA: Insufficient documentation

## 2012-10-25 DIAGNOSIS — Z8709 Personal history of other diseases of the respiratory system: Secondary | ICD-10-CM | POA: Insufficient documentation

## 2012-10-25 DIAGNOSIS — Z87898 Personal history of other specified conditions: Secondary | ICD-10-CM | POA: Insufficient documentation

## 2012-10-25 DIAGNOSIS — Z87442 Personal history of urinary calculi: Secondary | ICD-10-CM | POA: Insufficient documentation

## 2012-10-25 DIAGNOSIS — Z7901 Long term (current) use of anticoagulants: Secondary | ICD-10-CM | POA: Insufficient documentation

## 2012-10-25 DIAGNOSIS — Z8701 Personal history of pneumonia (recurrent): Secondary | ICD-10-CM | POA: Insufficient documentation

## 2012-10-25 DIAGNOSIS — Z8719 Personal history of other diseases of the digestive system: Secondary | ICD-10-CM | POA: Insufficient documentation

## 2012-10-25 DIAGNOSIS — Z7982 Long term (current) use of aspirin: Secondary | ICD-10-CM | POA: Insufficient documentation

## 2012-10-25 DIAGNOSIS — Z87891 Personal history of nicotine dependence: Secondary | ICD-10-CM | POA: Insufficient documentation

## 2012-10-25 DIAGNOSIS — Y939 Activity, unspecified: Secondary | ICD-10-CM | POA: Insufficient documentation

## 2012-10-25 DIAGNOSIS — Z8739 Personal history of other diseases of the musculoskeletal system and connective tissue: Secondary | ICD-10-CM | POA: Insufficient documentation

## 2012-10-25 DIAGNOSIS — Y929 Unspecified place or not applicable: Secondary | ICD-10-CM | POA: Insufficient documentation

## 2012-10-25 DIAGNOSIS — S61411A Laceration without foreign body of right hand, initial encounter: Secondary | ICD-10-CM

## 2012-10-25 DIAGNOSIS — Z8679 Personal history of other diseases of the circulatory system: Secondary | ICD-10-CM | POA: Insufficient documentation

## 2012-10-25 DIAGNOSIS — J45909 Unspecified asthma, uncomplicated: Secondary | ICD-10-CM | POA: Insufficient documentation

## 2012-10-25 DIAGNOSIS — G4733 Obstructive sleep apnea (adult) (pediatric): Secondary | ICD-10-CM | POA: Insufficient documentation

## 2012-10-25 DIAGNOSIS — W298XXA Contact with other powered powered hand tools and household machinery, initial encounter: Secondary | ICD-10-CM | POA: Insufficient documentation

## 2012-10-25 DIAGNOSIS — J4489 Other specified chronic obstructive pulmonary disease: Secondary | ICD-10-CM | POA: Insufficient documentation

## 2012-10-25 DIAGNOSIS — I10 Essential (primary) hypertension: Secondary | ICD-10-CM | POA: Insufficient documentation

## 2012-10-25 DIAGNOSIS — E119 Type 2 diabetes mellitus without complications: Secondary | ICD-10-CM | POA: Insufficient documentation

## 2012-10-25 DIAGNOSIS — Z79899 Other long term (current) drug therapy: Secondary | ICD-10-CM | POA: Insufficient documentation

## 2012-10-25 LAB — PROTIME-INR: Prothrombin Time: 22.3 seconds — ABNORMAL HIGH (ref 11.6–15.2)

## 2012-10-25 LAB — CBC WITH DIFFERENTIAL/PLATELET
Basophils Absolute: 0.1 10*3/uL (ref 0.0–0.1)
Basophils Relative: 1 % (ref 0–1)
Eosinophils Absolute: 0.1 10*3/uL (ref 0.0–0.7)
Eosinophils Relative: 1 % (ref 0–5)
HCT: 42.5 % (ref 39.0–52.0)
Hemoglobin: 15.1 g/dL (ref 13.0–17.0)
MCH: 30.9 pg (ref 26.0–34.0)
MCHC: 35.5 g/dL (ref 30.0–36.0)
Monocytes Absolute: 1 10*3/uL (ref 0.1–1.0)
Monocytes Relative: 10 % (ref 3–12)
RDW: 13 % (ref 11.5–15.5)

## 2012-10-25 LAB — BASIC METABOLIC PANEL
BUN: 6 mg/dL (ref 6–23)
Calcium: 8.8 mg/dL (ref 8.4–10.5)
Creatinine, Ser: 0.9 mg/dL (ref 0.50–1.35)
GFR calc Af Amer: >90 mL/min (ref >90)
GFR calc non Af Amer: >90 mL/min (ref >90)

## 2012-10-25 NOTE — ED Provider Notes (Signed)
History   This chart was scribed for Dan Canal, MD scribed by Magnus Sinning. The patient was seen in room MH01/MH01 at 18:55   CSN: [id number]  Arrival date & time 10/25/12  1650     Chief Complaint  Patient presents with  . Hand Injury    (Consider location/radiation/quality/duration/timing/severity/associated sxs/prior treatment) Patient is a 51 y.o. male presenting with hand injury. The history is provided by the patient. No language interpreter was used.  Hand Injury    Dan Nguyen is a 51 y.o. male who presents to the Emergency Department complaining of a small laceration to his right palm, onset this evening with associated mild pain. He says a drill bit broke and gouged his hand. He reports he did not check whether there was any presence of drill bit foreign bodies, but states he doesn't believe there is any foreign bodies present. Pt notes immediately after laceration he applied pressure, which has controlled bleeding.  Patient is on coumadin due to hx of  PFOand a.fib. He states his coumadin level was 3.5 and 3.25 two days ago. Patient also has hx of TIA and strokes. He reports his tetanus is UTD. Past Medical History  Diagnosis Date  . Diabetes mellitus   . Hypertension   . Stroke   . TIA (transient ischemic attack)     sees Dr. Henrietta Hoover with [facility] cardiology  . Chronic back pain   . BPH (benign prostatic hyperplasia)   . GERD (gastroesophageal reflux disease)   . Low testosterone   . Thyroid dysfunction   . COPD (chronic obstructive pulmonary disease)   . Asthma   . Shortness of breath   . Bronchitis     hx of  . Pneumonia     hx of  . Sleep apnea     does not have a cpap, sees Dr. Su Monks, [facility] pulmonology  . Dysrhythmia     hx of a-fib  . Kidney stone     hx of  . Headache     migraines  . Fibromyalgia     small fiber neuropathy    Past Surgical History  Procedure Date  . Cholecystectomy   . Facial reconstruction surgery   . Patent  foramen ovale closure   . Dupuytren / palmar fasciotomy   . Radial head excision   . Wrist arthroscopy 07/02/2012    Procedure: ARTHROSCOPY WRIST;  Surgeon: Marlowe Shores, MD;  Location: [facility] OR;  Service: Orthopedics;  Laterality: Left;  Left Wrist Arthroscopy with Debridement/ Left Open Ulnar Shortening Osteotomy     No family history on file.  History  Substance Use Topics  . Smoking status: Former Smoker -- 1.5 packs/day for 40 years    Types: Cigarettes    Quit date: 05/30/2012  . Smokeless tobacco: Never Used  . Alcohol Use: No      Review of Systems  Skin: Positive for wound.  All other systems reviewed and are negative.    Allergies  Bee venom; Darvocet; Penicillins; Darvon; and Prednisone  Home Medications   Current Outpatient Rx  Name  Route  Sig  Dispense  Refill  . ALBUTEROL SULFATE HFA 108 (90 BASE) MCG/ACT IN AERS   Inhalation   Inhale 2 puffs into the lungs every 4 (four) hours as needed. Shortness of breath         . ALBUTEROL SULFATE (2.5 MG/3ML) 0.083% IN NEBU   Nebulization   Take 2.5 mg by nebulization every 6 (six) hours as  needed. For shortness of breath and wheezing         . ASPIRIN EC 81 MG PO TBEC   Oral   Take 81 mg by mouth daily.         . ATENOLOL 50 MG PO TABS   Oral   Take 50 mg by mouth daily.          . BUDESONIDE-FORMOTEROL FUMARATE 80-4.5 MCG/ACT IN AERO   Inhalation   Inhale 2 puffs into the lungs 2 (two) times daily.         Marland Kitchen VITAMIN D3 5000 UNITS PO CAPS   Oral   Take 1 tablet by mouth 2 (two) times daily.         . CYCLOBENZAPRINE HCL 10 MG PO TABS   Oral   Take 10 mg by mouth 3 (three) times daily as needed. 1/2- 1 tablet         . EPINEPHRINE 0.3 MG/0.3ML IJ DEVI   Intramuscular   Inject 0.3 mg into the muscle as needed. For allergic reactions/anaphylaxis         . EZETIMIBE 10 MG PO TABS   Oral   Take 10 mg by mouth daily.         . FUROSEMIDE 20 MG PO TABS   Oral   Take 10-20 mg by  mouth daily.         Marland Kitchen LEVOTHYROXINE SODIUM 25 MCG PO TABS   Oral   Take 25 mcg by mouth daily.         Marland Kitchen LOSARTAN POTASSIUM 50 MG PO TABS   Oral   Take 50 mg by mouth daily.         Marland Kitchen METFORMIN HCL ER (MOD) 1000 MG PO TB24   Oral   Take 1,000 mg by mouth daily with breakfast.          . METHOCARBAMOL 750 MG PO TABS   Oral   Take 750 mg by mouth 3 (three) times daily.         . MORPHINE SULFATE ER 100 MG PO CP24   Oral   Take 100 mg by mouth 3 (three) times daily.          . OXYCODONE-ACETAMINOPHEN 10-325 MG PO TABS   Oral   Take 1 tablet by mouth every 4 (four) hours as needed. For pain         . PANTOPRAZOLE SODIUM 40 MG PO TBEC   Oral   Take 40 mg by mouth 2 (two) times daily.          Marland Kitchen PREGABALIN 200 MG PO CAPS   Oral   Take 200 mg by mouth 3 (three) times daily.         Marland Kitchen PROMETHAZINE HCL 25 MG PO TABS   Oral   Take 25 mg by mouth every 6 (six) hours as needed. For nausea         . TAMSULOSIN HCL 0.4 MG PO CAPS   Oral   Take 0.4 mg by mouth daily after supper.          . TESTOSTERONE CYPIONATE IM   Intramuscular   Inject 200 mg into the muscle every 14 (fourteen) days.         Marland Kitchen VARENICLINE TARTRATE 1 MG PO TABS   Oral   Take 1 mg by mouth 2 (two) times daily.         . WARFARIN SODIUM 7.5 MG PO TABS  Oral   Take 7.5 mg by mouth daily. Tues, thu, Sat, Sun         . WARFARIN SODIUM 5 MG PO TABS   Oral   Take 5 mg by mouth daily. Mon, Wed, Fri         . GABAPENTIN 800 MG PO TABS   Oral   Take 800 mg by mouth 2 (two) times daily.          Marland Kitchen METAXALONE 800 MG PO TABS   Oral   Take 800 mg by mouth 3 (three) times daily.          . TESTOSTERONE 2.5 MG/24HR TD PT24   Transdermal   Place 1 patch onto the skin daily.          . WARFARIN SODIUM 5 MG PO TABS   Oral   Take 5 mg by mouth 4 (four) times a week. Take on Tues, Thurs, Sat and Sun         . WARFARIN SODIUM 7.5 MG PO TABS   Oral   Take 7.5 mg by  mouth 3 (three) times a week. Take on Mon, Wed and Fri           BP 118/86  Pulse 102  Temp 98 F (36.7 C) (Oral)  Resp 20  Ht 5\' 9"  (1.753 m)  Wt 255 lb (115.667 kg)  BMI 37.66 kg/m2  SpO2 95%  Physical Exam  Nursing note and vitals reviewed. Constitutional: He is oriented to person, place, and time. He appears well-developed and well-nourished. No distress.  HENT:  Head: Normocephalic and atraumatic.  Eyes: Conjunctivae normal and EOM are normal.  Neck: Neck supple. No tracheal deviation present.  Cardiovascular: Normal rate.   Pulmonary/Chest: Effort normal. No respiratory distress.  Abdominal: He exhibits no distension.  Musculoskeletal: Normal range of motion.  Neurological: He is alert and oriented to person, place, and time. No sensory deficit.  Skin: Skin is dry.       Superficial laceration with a skin flap on the palmar side of the right hand. No active bleeding. Skin approximation is well.   Psychiatric: He has a normal mood and affect. His behavior is normal.    ED Course  Procedures (including critical care time) 15:56: Physical exam performed  Laceration repair  I cleaned the wound with sterile saline. No foreign body visualized. 4 steri strips placed. Dressing applied, no complications.   Labs Reviewed  PROTIME-INR - Abnormal; Notable for the following:    Prothrombin Time 22.3 (*)     INR 2.05 (*)     All other components within normal limits  CBC WITH DIFFERENTIAL  BASIC METABOLIC PANEL   Dg Hand Complete Right  10/25/2012  *RADIOLOGY REPORT*  Clinical Data: Right hand trauma  RIGHT HAND - COMPLETE 3+ VIEW  Comparison: None.  Findings: Bandage artifact is present. No fracture or dislocation. No soft tissue abnormality.  No radiopaque foreign body.  IMPRESSION: No acute abnormality.   Original Report Authenticated By: Christiana Pellant, M.D.      No diagnosis found.    MDM  WESS BANEY is a 51 y.o. male here with L hand injury with small  superficial laceration. Xray showed no foreign body. Wound cleaned. The skin flap approximates well. I applied steri strips instead of suturing because the laceration is very superficial and I don't want to cause more bleeding. INR 2.05, CBC, BMP nl. Will continue on same dose of coumadin. Return precautions given.  I personally performed the services described in this documentation, which was scribed in my presence. The recorded information has been reviewed and is accurate.       Dan Canal, MD 10/25/12 2122

## 2012-10-25 NOTE — ED Notes (Signed)
Pt reports drill bit gouged left hand- pt on coumadin and states hasn't been able to get bleeding to stop- not currently bleeding, dressing applied in triage- on Thursday pt/INR was 42/3.5

## 2012-10-25 NOTE — ED Notes (Signed)
Pt currently in Xray dept unable to assess at this time

## 2013-02-13 ENCOUNTER — Other Ambulatory Visit: Payer: Self-pay | Admitting: Orthopedic Surgery

## 2013-02-19 ENCOUNTER — Encounter (HOSPITAL_BASED_OUTPATIENT_CLINIC_OR_DEPARTMENT_OTHER): Payer: Self-pay | Admitting: *Deleted

## 2013-02-24 ENCOUNTER — Encounter (HOSPITAL_COMMUNITY): Payer: Self-pay | Admitting: *Deleted

## 2013-02-24 MED ORDER — VANCOMYCIN HCL IN DEXTROSE 1-5 GM/200ML-% IV SOLN
1000.0000 mg | INTRAVENOUS | Status: AC
  Administered 2013-02-25: 1000 mg via INTRAVENOUS
  Filled 2013-02-24: qty 200

## 2013-02-24 NOTE — Progress Notes (Signed)
Requested ECHO,Stress test and OV from [facility] Dr. Clotilde Dieter.  Sleep Study requested from [facility]  In [address].

## 2013-02-25 ENCOUNTER — Encounter (HOSPITAL_COMMUNITY): Payer: Self-pay | Admitting: Anesthesiology

## 2013-02-25 ENCOUNTER — Encounter (HOSPITAL_COMMUNITY)
Admission: RE | Disposition: A | Discharge status: Home / Self Care | Payer: Self-pay | Source: Ambulatory Visit | Attending: Orthopedic Surgery

## 2013-02-25 ENCOUNTER — Encounter (HOSPITAL_COMMUNITY): Payer: Self-pay | Admitting: *Deleted

## 2013-02-25 ENCOUNTER — Ambulatory Visit (HOSPITAL_COMMUNITY)
Admission: RE | Admit: 2013-02-25 | Discharge: 2013-02-25 | Disposition: A | Discharge status: Home / Self Care | Payer: Medicare Other | Source: Ambulatory Visit | Attending: Orthopedic Surgery | Admitting: Orthopedic Surgery

## 2013-02-25 ENCOUNTER — Encounter (HOSPITAL_BASED_OUTPATIENT_CLINIC_OR_DEPARTMENT_OTHER): Admission: RE | Payer: Self-pay | Source: Ambulatory Visit

## 2013-02-25 ENCOUNTER — Ambulatory Visit (HOSPITAL_COMMUNITY): Payer: Medicare Other | Admitting: Anesthesiology

## 2013-02-25 ENCOUNTER — Ambulatory Visit (HOSPITAL_BASED_OUTPATIENT_CLINIC_OR_DEPARTMENT_OTHER): Admission: RE | Admit: 2013-02-25 | Payer: Medicare Other | Source: Ambulatory Visit | Admitting: Orthopedic Surgery

## 2013-02-25 DIAGNOSIS — M25532 Pain in left wrist: Secondary | ICD-10-CM

## 2013-02-25 DIAGNOSIS — Z7901 Long term (current) use of anticoagulants: Secondary | ICD-10-CM | POA: Insufficient documentation

## 2013-02-25 DIAGNOSIS — Z888 Allergy status to other drugs, medicaments and biological substances status: Secondary | ICD-10-CM | POA: Insufficient documentation

## 2013-02-25 DIAGNOSIS — G4730 Sleep apnea, unspecified: Secondary | ICD-10-CM | POA: Insufficient documentation

## 2013-02-25 DIAGNOSIS — Z8673 Personal history of transient ischemic attack (TIA), and cerebral infarction without residual deficits: Secondary | ICD-10-CM | POA: Insufficient documentation

## 2013-02-25 DIAGNOSIS — N400 Enlarged prostate without lower urinary tract symptoms: Secondary | ICD-10-CM | POA: Insufficient documentation

## 2013-02-25 DIAGNOSIS — K219 Gastro-esophageal reflux disease without esophagitis: Secondary | ICD-10-CM | POA: Insufficient documentation

## 2013-02-25 DIAGNOSIS — J449 Chronic obstructive pulmonary disease, unspecified: Secondary | ICD-10-CM | POA: Insufficient documentation

## 2013-02-25 DIAGNOSIS — J4489 Other specified chronic obstructive pulmonary disease: Secondary | ICD-10-CM | POA: Insufficient documentation

## 2013-02-25 DIAGNOSIS — G5600 Carpal tunnel syndrome, unspecified upper limb: Secondary | ICD-10-CM | POA: Insufficient documentation

## 2013-02-25 DIAGNOSIS — E291 Testicular hypofunction: Secondary | ICD-10-CM | POA: Insufficient documentation

## 2013-02-25 DIAGNOSIS — G5620 Lesion of ulnar nerve, unspecified upper limb: Secondary | ICD-10-CM | POA: Insufficient documentation

## 2013-02-25 DIAGNOSIS — I10 Essential (primary) hypertension: Secondary | ICD-10-CM | POA: Insufficient documentation

## 2013-02-25 DIAGNOSIS — Z91038 Other insect allergy status: Secondary | ICD-10-CM | POA: Insufficient documentation

## 2013-02-25 DIAGNOSIS — E039 Hypothyroidism, unspecified: Secondary | ICD-10-CM | POA: Insufficient documentation

## 2013-02-25 DIAGNOSIS — Z880 Allergy status to penicillin: Secondary | ICD-10-CM | POA: Insufficient documentation

## 2013-02-25 DIAGNOSIS — IMO0001 Reserved for inherently not codable concepts without codable children: Secondary | ICD-10-CM | POA: Insufficient documentation

## 2013-02-25 DIAGNOSIS — G5601 Carpal tunnel syndrome, right upper limb: Secondary | ICD-10-CM

## 2013-02-25 DIAGNOSIS — Z79899 Other long term (current) drug therapy: Secondary | ICD-10-CM | POA: Insufficient documentation

## 2013-02-25 HISTORY — PX: CARPAL TUNNEL WITH CUBITAL TUNNEL: SHX5608

## 2013-02-25 HISTORY — DX: Hypothyroidism, unspecified: E03.9

## 2013-02-25 HISTORY — DX: Unspecified osteoarthritis, unspecified site: M19.90

## 2013-02-25 HISTORY — DX: Depression, unspecified: F32.A

## 2013-02-25 HISTORY — DX: Major depressive disorder, single episode, unspecified: F32.9

## 2013-02-25 LAB — CBC
Hemoglobin: 17.8 g/dL — ABNORMAL HIGH (ref 13.0–17.0)
MCH: 30.9 pg (ref 26.0–34.0)
Platelets: 250 10*3/uL (ref 150–400)
RBC: 5.76 MIL/uL (ref 4.22–5.81)
WBC: 7.8 10*3/uL (ref 4.0–10.5)

## 2013-02-25 LAB — SURGICAL PCR SCREEN
MRSA, PCR: NEGATIVE
Staphylococcus aureus: POSITIVE — AB

## 2013-02-25 LAB — BASIC METABOLIC PANEL
Calcium: 9.6 mg/dL (ref 8.4–10.5)
GFR calc Af Amer: >90 mL/min (ref >90)
GFR calc non Af Amer: >90 mL/min (ref >90)
Glucose, Bld: 100 mg/dL — ABNORMAL HIGH (ref 70–99)
Potassium: 4.4 mEq/L (ref 3.5–5.1)
Sodium: 139 mEq/L (ref 135–145)

## 2013-02-25 LAB — GLUCOSE, CAPILLARY: Glucose-Capillary: 117 mg/dL — ABNORMAL HIGH (ref 70–99)

## 2013-02-25 SURGERY — RELEASE, CARPAL TUNNEL AND CUBITAL TUNNEL
Anesthesia: General | Site: Wrist | Laterality: Right | Wound class: Clean

## 2013-02-25 SURGERY — RELEASE, CARPAL TUNNEL AND CUBITAL TUNNEL
Anesthesia: General | Site: Wrist | Laterality: Right

## 2013-02-25 MED ORDER — LACTATED RINGERS IV SOLN
INTRAVENOUS | Status: DC
  Administered 2013-02-25: 13:00:00 via INTRAVENOUS

## 2013-02-25 MED ORDER — EPHEDRINE SULFATE 50 MG/ML IJ SOLN
INTRAMUSCULAR | Status: DC | PRN
  Administered 2013-02-25: 15 mg via INTRAVENOUS
  Administered 2013-02-25 (×2): 10 mg via INTRAVENOUS

## 2013-02-25 MED ORDER — GLYCOPYRROLATE 0.2 MG/ML IJ SOLN
INTRAMUSCULAR | Status: DC | PRN
  Administered 2013-02-25: 0.2 mg via INTRAVENOUS

## 2013-02-25 MED ORDER — 0.9 % SODIUM CHLORIDE (POUR BTL) OPTIME
TOPICAL | Status: DC | PRN
  Administered 2013-02-25: 1000 mL

## 2013-02-25 MED ORDER — ONDANSETRON HCL 4 MG/2ML IJ SOLN
INTRAMUSCULAR | Status: DC | PRN
  Administered 2013-02-25: 4 mg via INTRAVENOUS

## 2013-02-25 MED ORDER — FENTANYL CITRATE 0.05 MG/ML IJ SOLN
INTRAMUSCULAR | Status: AC
  Filled 2013-02-25: qty 2

## 2013-02-25 MED ORDER — ESMOLOL HCL 10 MG/ML IV SOLN
INTRAVENOUS | Status: DC | PRN
  Administered 2013-02-25: 30 mg via INTRAVENOUS
  Administered 2013-02-25: 20 mg via INTRAVENOUS

## 2013-02-25 MED ORDER — HYDROMORPHONE HCL PF 1 MG/ML IJ SOLN: 0.2500 mg | INTRAMUSCULAR | Status: DC | PRN

## 2013-02-25 MED ORDER — TRIAMCINOLONE ACETONIDE 40 MG/ML IJ SUSP
INTRAMUSCULAR | Status: AC
  Filled 2013-02-25: qty 5

## 2013-02-25 MED ORDER — PROMETHAZINE HCL 25 MG/ML IJ SOLN
INTRAMUSCULAR | Status: AC
  Filled 2013-02-25: qty 1

## 2013-02-25 MED ORDER — OXYCODONE HCL 5 MG/5ML PO SOLN: 5.0000 mg | Freq: Once | ORAL | Status: DC | PRN

## 2013-02-25 MED ORDER — TRIAMCINOLONE ACETONIDE 40 MG/ML IJ SUSP
INTRAMUSCULAR | Status: DC | PRN
  Administered 2013-02-25: 40 mg

## 2013-02-25 MED ORDER — LIDOCAINE HCL (CARDIAC) 20 MG/ML IV SOLN
INTRAVENOUS | Status: DC | PRN
  Administered 2013-02-25: 70 mg via INTRAVENOUS

## 2013-02-25 MED ORDER — PHENYLEPHRINE HCL 10 MG/ML IJ SOLN
10.0000 mg | INTRAVENOUS | Status: DC | PRN
  Administered 2013-02-25: 25 ug/min via INTRAVENOUS

## 2013-02-25 MED ORDER — PHENYLEPHRINE HCL 10 MG/ML IJ SOLN
INTRAMUSCULAR | Status: DC | PRN
  Administered 2013-02-25: 120 ug via INTRAVENOUS
  Administered 2013-02-25: 80 ug via INTRAVENOUS

## 2013-02-25 MED ORDER — ARTIFICIAL TEARS OP OINT
TOPICAL_OINTMENT | OPHTHALMIC | Status: DC | PRN
  Administered 2013-02-25: 1 via OPHTHALMIC

## 2013-02-25 MED ORDER — PROMETHAZINE HCL 25 MG/ML IJ SOLN
12.5000 mg | Freq: Once | INTRAMUSCULAR | Status: AC
  Administered 2013-02-25: 12.5 mg via INTRAVENOUS

## 2013-02-25 MED ORDER — PROPOFOL 10 MG/ML IV BOLUS
INTRAVENOUS | Status: DC | PRN
  Administered 2013-02-25: 200 mg via INTRAVENOUS

## 2013-02-25 MED ORDER — OXYCODONE HCL 5 MG PO TABS: 5.0000 mg | ORAL_TABLET | Freq: Once | ORAL | Status: DC | PRN

## 2013-02-25 MED ORDER — ONDANSETRON HCL 4 MG/2ML IJ SOLN: 4.0000 mg | Freq: Four times a day (QID) | INTRAMUSCULAR | Status: DC | PRN

## 2013-02-25 MED ORDER — CHLORHEXIDINE GLUCONATE 4 % EX LIQD: 60.0000 mL | Freq: Once | CUTANEOUS | Status: DC

## 2013-02-25 MED ORDER — BUPIVACAINE HCL (PF) 0.25 % IJ SOLN
INTRAMUSCULAR | Status: DC | PRN
  Administered 2013-02-25: 1 mL

## 2013-02-25 MED ORDER — FENTANYL CITRATE 0.05 MG/ML IJ SOLN
INTRAMUSCULAR | Status: DC | PRN
  Administered 2013-02-25 (×2): 50 ug via INTRAVENOUS

## 2013-02-25 MED ORDER — SUCCINYLCHOLINE CHLORIDE 20 MG/ML IJ SOLN
INTRAMUSCULAR | Status: DC | PRN
  Administered 2013-02-25: 140 mg via INTRAVENOUS

## 2013-02-25 MED ORDER — HYDROMORPHONE HCL 2 MG PO TABS: 2.0000 mg | ORAL_TABLET | ORAL | Status: AC | PRN

## 2013-02-25 MED ORDER — MUPIROCIN 2 % EX OINT
TOPICAL_OINTMENT | CUTANEOUS | Status: AC
  Filled 2013-02-25: qty 22

## 2013-02-25 MED ORDER — LACTATED RINGERS IV SOLN
INTRAVENOUS | Status: DC | PRN
  Administered 2013-02-25 (×2): via INTRAVENOUS

## 2013-02-25 MED ORDER — BUPIVACAINE HCL (PF) 0.25 % IJ SOLN
INTRAMUSCULAR | Status: AC
  Filled 2013-02-25: qty 30

## 2013-02-25 MED ORDER — MUPIROCIN 2 % EX OINT
TOPICAL_OINTMENT | Freq: Two times a day (BID) | CUTANEOUS | Status: DC
  Administered 2013-02-25: 1 via NASAL

## 2013-02-25 MED ORDER — FENTANYL CITRATE 0.05 MG/ML IJ SOLN
100.0000 ug | Freq: Once | INTRAMUSCULAR | Status: AC
  Administered 2013-02-25: 100 ug via INTRAVENOUS

## 2013-02-25 MED ORDER — MIDAZOLAM HCL 5 MG/5ML IJ SOLN
INTRAMUSCULAR | Status: DC | PRN
  Administered 2013-02-25 (×2): 1 mg via INTRAVENOUS

## 2013-02-25 SURGICAL SUPPLY — 36 items
APL SKNCLS STERI-STRIP NONHPOA (GAUZE/BANDAGES/DRESSINGS) ×1
BANDAGE ELASTIC 4 VELCRO ST LF (GAUZE/BANDAGES/DRESSINGS) ×2 IMPLANT
BANDAGE GAUZE ELAST BULKY 4 IN (GAUZE/BANDAGES/DRESSINGS) ×2 IMPLANT
BENZOIN TINCTURE PRP APPL 2/3 (GAUZE/BANDAGES/DRESSINGS) ×2 IMPLANT
BNDG CMPR 9X4 STRL LF SNTH (GAUZE/BANDAGES/DRESSINGS) ×1
BNDG ESMARK 4X9 LF (GAUZE/BANDAGES/DRESSINGS) ×2 IMPLANT
CLOTH BEACON ORANGE TIMEOUT ST (SAFETY) ×2 IMPLANT
CLSR STERI-STRIP ANTIMIC 1/2X4 (GAUZE/BANDAGES/DRESSINGS) ×2 IMPLANT
CORDS BIPOLAR (ELECTRODE) ×2 IMPLANT
COVER SURGICAL LIGHT HANDLE (MISCELLANEOUS) ×2 IMPLANT
CUFF TOURNIQUET SINGLE 18IN (TOURNIQUET CUFF) ×2 IMPLANT
DRAPE SURG 17X23 STRL (DRAPES) ×2 IMPLANT
DURAPREP 26ML APPLICATOR (WOUND CARE) ×2 IMPLANT
GLOVE BIO SURGEON STRL SZ8.5 (GLOVE) ×2 IMPLANT
GOWN PREVENTION PLUS XXLARGE (GOWN DISPOSABLE) ×2 IMPLANT
GOWN SRG XL XLNG 56XLVL 4 (GOWN DISPOSABLE) ×1 IMPLANT
GOWN STRL NON-REIN LRG LVL3 (GOWN DISPOSABLE) ×2 IMPLANT
GOWN STRL NON-REIN XL XLG LVL4 (GOWN DISPOSABLE) ×2
KIT BASIN OR (CUSTOM PROCEDURE TRAY) ×2 IMPLANT
KIT ROOM TURNOVER OR (KITS) ×2 IMPLANT
NEEDLE 22X1 1/2 (OR ONLY) (NEEDLE) ×2 IMPLANT
NS IRRIG 1000ML POUR BTL (IV SOLUTION) ×2 IMPLANT
PACK ORTHO EXTREMITY (CUSTOM PROCEDURE TRAY) ×2 IMPLANT
PAD ARMBOARD 7.5X6 YLW CONV (MISCELLANEOUS) ×4 IMPLANT
PAD CAST 4YDX4 CTTN HI CHSV (CAST SUPPLIES) ×1 IMPLANT
PADDING CAST COTTON 4X4 STRL (CAST SUPPLIES) ×2
SPONGE GAUZE 4X4 12PLY (GAUZE/BANDAGES/DRESSINGS) ×2 IMPLANT
STRIP CLOSURE SKIN 1/2X4 (GAUZE/BANDAGES/DRESSINGS) ×2 IMPLANT
SUT PROLENE 3 0 PS 2 (SUTURE) ×2 IMPLANT
SUT VIC AB 2-0 SH 27 (SUTURE) ×2
SUT VIC AB 2-0 SH 27XBRD (SUTURE) ×1 IMPLANT
SYR CONTROL 10ML LL (SYRINGE) ×2 IMPLANT
TOWEL OR 17X24 6PK STRL BLUE (TOWEL DISPOSABLE) ×2 IMPLANT
TOWEL OR 17X26 10 PK STRL BLUE (TOWEL DISPOSABLE) ×2 IMPLANT
TOWEL OR NON WOVEN STRL DISP B (DISPOSABLE) ×2 IMPLANT
UNDERPAD 30X30 INCONTINENT (UNDERPADS AND DIAPERS) ×2 IMPLANT

## 2013-02-25 NOTE — Anesthesia Preprocedure Evaluation (Signed)
Anesthesia Evaluation  Patient identified by MRN, date of birth, ID band Patient awake    Reviewed: Allergy & Precautions, H&P , NPO status , Patient's Chart, lab work & pertinent test results  Airway Mallampati: II  Neck ROM: full    Dental   Pulmonary shortness of breath, asthma , sleep apnea , COPDformer smoker,          Cardiovascular hypertension, + dysrhythmias Atrial Fibrillation     Neuro/Psych  Headaches, Depression TIA Neuromuscular disease CVA    GI/Hepatic GERD-  ,  Endo/Other  diabetes, Type 2Hypothyroidism Morbid obesity  Renal/GU      Musculoskeletal  (+) Arthritis -, Fibromyalgia -  Abdominal   Peds  Hematology   Anesthesia Other Findings   Reproductive/Obstetrics                           Anesthesia Physical Anesthesia Plan  ASA: III  Anesthesia Plan: General   Post-op Pain Management:    Induction: Intravenous  Airway Management Planned: LMA  Additional Equipment:   Intra-op Plan:   Post-operative Plan:   Informed Consent: I have reviewed the patients History and Physical, chart, labs and discussed the procedure including the risks, benefits and alternatives for the proposed anesthesia with the patient or authorized representative who has indicated his/her understanding and acceptance.     Plan Discussed with: CRNA, Anesthesiologist and Surgeon  Anesthesia Plan Comments:         Anesthesia Quick Evaluation

## 2013-02-25 NOTE — Progress Notes (Signed)
Dr. Kem Parkinson office states thatnthey have no information of this pt.

## 2013-02-25 NOTE — Op Note (Signed)
See note [id number]

## 2013-02-25 NOTE — Anesthesia Postprocedure Evaluation (Signed)
Anesthesia Post Note  Patient: Dan Nguyen  Procedure(s) Performed: Procedure(s) (LRB): RIGHT CARPAL TUNNEL RELEASE WITH RIGHT CUBITAL TUNNEL RELEASE, LEFT WRIST INJECTION  (Right)  Anesthesia type: General  Patient location: PACU  Post pain: Pain level controlled and Adequate analgesia  Post assessment: Post-op Vital signs reviewed, Patient's Cardiovascular Status Stable, Respiratory Function Stable, Patent Airway and Pain level controlled  Last Vitals:  Filed Vitals:   02/25/13 1403  BP: 140/87  Pulse: 92  Temp: 36.8 C  Resp: 20    Post vital signs: Reviewed and stable  Level of consciousness: awake, alert  and oriented  Complications: No apparent anesthesia complications

## 2013-02-25 NOTE — Anesthesia Procedure Notes (Signed)
Procedure Name: Intubation Date/Time: 02/25/2013 12:57 PM Performed by: Gayla Medicus Pre-anesthesia Checklist: Patient identified, Timeout performed, Suction available, Patient being monitored and Emergency Drugs available Patient Re-evaluated:Patient Re-evaluated prior to inductionOxygen Delivery Method: Circle system utilized Preoxygenation: Pre-oxygenation with 100% oxygen Intubation Type: IV induction Ventilation: Two handed mask ventilation required and Oral airway inserted - appropriate to patient size Grade View: Grade I Tube type: Oral Tube size: 7.5 mm Number of attempts: 1 Airway Equipment and Method: Stylet and Video-laryngoscopy Placement Confirmation: ETT inserted through vocal cords under direct vision,  positive ETCO2 and breath sounds checked- equal and bilateral Secured at: 22 cm Tube secured with: Tape Dental Injury: Teeth and Oropharynx as per pre-operative assessment  Difficulty Due To: Difficulty was anticipated

## 2013-02-25 NOTE — H&P (Signed)
Dan Nguyen is an 52 y.o. male.   Chief Complaint: right arm numbness and left wrist pain HPI: as above with right CTS and Cubital tunnel syndrome and chronic left wrist pain  Past Medical History  Diagnosis Date  . Hypertension   . TIA (transient ischemic attack)     sees Dr. Henrietta Nguyen with [facility] cardiology  . Chronic back pain   . BPH (benign prostatic hyperplasia)   . GERD (gastroesophageal reflux disease)   . Low testosterone   . Thyroid dysfunction   . COPD (chronic obstructive pulmonary disease)   . Asthma   . Bronchitis     hx of  . Pneumonia     hx of  . Kidney stone     hx of  . Headache     migraines  . Fibromyalgia     small fiber neuropathy  . Dysrhythmia     hx of a-fib  . Hypothyroidism   . Depression   . Shortness of breath     no problems lately  . Diabetes mellitus     type 2  no medicatios  . 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 Arthritis   . Sleep apnea     does not have a cpap, sees Dr. Su Nguyen, [facility] pulmonology    Past Surgical History  Procedure Laterality Date  . Cholecystectomy    . Facial reconstruction surgery    . Patent foramen ovale closure    . Dupuytren / palmar fasciotomy    . Radial head excision    . Wrist arthroscopy  07/02/2012    Procedure: ARTHROSCOPY WRIST;  Surgeon: Dan Shores, MD;  Location: [facility] OR;  Service: Orthopedics;  Laterality: Left;  Left Wrist Arthroscopy with Debridement/ Left Open Ulnar Shortening Osteotomy     History reviewed. No pertinent family history. Social History:  reports that he quit smoking about 8 months ago. His smoking use included Cigarettes. He has a 60 pack-year smoking history. He has never used smokeless tobacco. He reports that he does not drink alcohol or use illicit drugs.  Allergies:  Allergies  Allergen Reactions  . Bee Venom Anaphylaxis  . Darvocet (Propoxyphene-Acetaminophen)     respiratory failure  . Penicillins     respiratory failure  .  Darvon   . Prednisone     Becomes very aggressive     Medications Prior to Admission  Medication Sig Dispense Refill  . albuterol (PROVENTIL HFA;VENTOLIN HFA) 108 (90 BASE) MCG/ACT inhaler Inhale 2 puffs into the lungs every 4 (four) hours as needed. Shortness of breath      . albuterol (PROVENTIL) (2.5 MG/3ML) 0.083% nebulizer solution Take 2.5 mg by nebulization every 6 (six) hours as needed. For shortness of breath and wheezing      . atenolol (TENORMIN) 50 MG tablet Take 50 mg by mouth daily.       . budesonide-formoterol (SYMBICORT) 80-4.5 MCG/ACT inhaler Inhale 2 puffs into the lungs 2 (two) times daily.      . Cholecalciferol (VITAMIN D3) 5000 UNITS CAPS Take 1 tablet by mouth 2 (two) times daily.      . dabigatran (PRADAXA) 150 MG CAPS Take 150 mg by mouth every 12 (twelve) hours.      . DULoxetine (CYMBALTA) 60 MG capsule Take 60 mg by mouth daily.      Marland Kitchen EPINEPHrine (EPI-PEN) 0.3 mg/0.3 mL DEVI Inject 0.3 mg into the muscle as needed. For allergic reactions/anaphylaxis      .  ezetimibe (ZETIA) 10 MG tablet Take 10 mg by mouth daily.      . furosemide (LASIX) 20 MG tablet Take 10-20 mg by mouth daily.      Marland Kitchen gabapentin (NEURONTIN) 800 MG tablet Take 800 mg by mouth 2 (two) times daily.       Marland Kitchen levothyroxine (SYNTHROID, LEVOTHROID) 25 MCG tablet Take 25 mcg by mouth daily.      Marland Kitchen losartan (COZAAR) 50 MG tablet Take 50 mg by mouth daily.      . methocarbamol (ROBAXIN) 750 MG tablet Take 750 mg by mouth 3 (three) times daily.      Marland Kitchen morphine (KADIAN) 60 MG 24 hr capsule Take 60 mg by mouth 3 (three) times daily.      Marland Kitchen oxyCODONE-acetaminophen (PERCOCET) 10-325 MG per tablet Take 1 tablet by mouth every 4 (four) hours as needed. For pain      . pantoprazole (PROTONIX) 40 MG tablet Take 40 mg by mouth 2 (two) times daily.       Marland Kitchen PRESCRIPTION MEDICATION Apply 1 application topically 2 (two) times daily. Triamcinolone/Cetaphil 0.1% 50/50 mix      . promethazine (PHENERGAN) 25 MG tablet  Take 25 mg by mouth every 6 (six) hours as needed. For nausea      . sildenafil (VIAGRA) 100 MG tablet Take 100 mg by mouth daily as needed for erectile dysfunction.      . Tamsulosin HCl (FLOMAX) 0.4 MG CAPS Take 0.4 mg by mouth daily after supper.       . TESTOSTERONE CYPIONATE IM Inject 200 mg into the muscle every 14 (fourteen) days.        No results found for this or any previous visit (from the past 48 hour(s)). No results found.  Review of Systems  Constitutional: Negative.   All other systems reviewed and are negative.    Blood pressure 122/76, pulse 87, temperature 97.3 F (36.3 C), temperature source Oral, resp. rate 20, height 5' 9.5" (1.765 m), weight 122.471 kg (270 lb), SpO2 96.00%. Physical Exam  Constitutional: He is oriented to person, place, and time. He appears well-developed and well-nourished.  HENT:  Head: Normocephalic and atraumatic.  Cardiovascular: Normal rate.   Respiratory: Effort normal.  Musculoskeletal:       Right elbow: Tenderness found. Medial epicondyle tenderness noted.       Right wrist: He exhibits tenderness.       Left wrist: He exhibits tenderness.  Neurological: He is alert and oriented to person, place, and time.  Skin: Skin is warm.  Psychiatric: He has a normal mood and affect. His behavior is normal. Judgment and thought content normal.     Assessment/Plan As above  Plan nerve releases right side and injection of left wrist  Dan Nguyen A 02/25/2013, 12:35 PM

## 2013-02-25 NOTE — Transfer of Care (Signed)
Immediate Anesthesia Transfer of Care Note  Patient: Dan Nguyen  Procedure(s) Performed: Procedure(s): RIGHT CARPAL TUNNEL RELEASE WITH RIGHT CUBITAL TUNNEL RELEASE, LEFT WRIST INJECTION  (Right)  Patient Location: PACU  Anesthesia Type:General  Level of Consciousness: awake, alert  and oriented  Airway & Oxygen Therapy: Patient Spontanous Breathing and Patient connected to face mask oxygen  Post-op Assessment: Report given to PACU RN, Post -op Vital signs reviewed and stable and Patient moving all extremities X 4  Post vital signs: Reviewed and stable  Complications: No apparent anesthesia complications

## 2013-02-25 NOTE — Preoperative (Signed)
Beta Blockers   Reason not to administer Beta Blockers:Not Applicable 

## 2013-02-26 ENCOUNTER — Encounter (HOSPITAL_COMMUNITY): Payer: Self-pay | Admitting: Orthopedic Surgery

## 2013-02-26 NOTE — Op Note (Signed)
Dan Nguyen, Dan Nguyen                ACCOUNT NO.:  [id number]  MEDICAL RECORD NO.:  [id number]  LOCATION:  [facility]                         FACILITY:  [facility]  PHYSICIAN:  Artist Pais. Alazne Quant, M.D.DATE OF BIRTH:  May 26, 1961  DATE OF PROCEDURE:  02/25/2013 DATE OF DISCHARGE:  02/25/2013                              OPERATIVE REPORT   PREOPERATIVE DIAGNOSIS:  Chronic right carpal cubital tunnel syndromes, and chronic left wrist pain.  POSTOPERATIVE DIAGNOSIS:  Chronic right carpal cubital tunnel syndromes, and chronic left wrist pain.  PROCEDURE: 1. Right carpal tunnel release. 2. Right cubital tunnel release. 3. Left wrist radiocarpal injection.  SURGEON:  Artist Pais. Mina Marble, M.D.  ASSISTANT:  None.  ANESTHESIA:  General.  COMPLICATION:  No complication.  DRAINS:  No drains.  The patient was taken to operating suite.  After induction of general anesthesia, the right upper extremity was prepped and draped in sterile fashion.  Esmarch was used to exsanguinate the limb.  Tourniquet was inflated to 250 mmHg.  At this point in time, an incision made in the palmar aspect of right hand in line with the long metacarpal starting Kaplan's cardinal line.  Skin was incised.  Palmar fascia was identified and split.  Distal edge of the transverse carpal ligament was identified, split with 15 blade.  Median nerve was identified protected with a Therapist, nutritional.  Remaining aspects of the transverse carpal ligament were divided under direct vision using curved blunt scissors. Canal was inspected.  There were osseous lesions or ganglions present was irrigated and loosely closed with a 3-0 Prolene subcuticular stitch. Second incision was made on the medial aspect of the right elbow centered between olecranon process and tip of the medial epicondyle. Skin was incised sharply.  Dissection was carried down to the cubital tunnel.  The ulnar nerve was carefully identified and entered through the  cubital tunnel.  Medial antebrachial cutaneous nerve branches were retracted and protected.  The cubital tunnel was unroofed.  The ulnar were decompressed distally under skin bridge including release of the fascia overlying the flexor carpi ulnaris muscles and proximally under skin bridge including release to the intermuscular septum.  All pressure points relieved, nerve was stable in the groove.  The wound was irrigated and loosely closed in layers with 2-0 undyed Vicryl and a 3-0 Prolene subcuticular stitch on the skin.  Steri-Strips, 4x4s, fluffs, and compressive dressing was applied to both the wrist and the elbow under sterile conditions with am alcohol prep, the left radiocarpal joint was prepped and 1 mL Kenalog and 1 mL of 0.25% plain Marcaine injected in the radiocarpal joint. Patient has vessel injection tolerated all procedures well, and went to recovery room in stable fashion.     Artist Pais Mina Marble, M.D.     MAW/MEDQ  D:  02/25/2013  T:  02/26/2013  Job:  [id number]

## 2020-10-04 ENCOUNTER — Emergency Department (HOSPITAL_BASED_OUTPATIENT_CLINIC_OR_DEPARTMENT_OTHER): Payer: 59

## 2020-10-04 ENCOUNTER — Other Ambulatory Visit: Payer: Self-pay

## 2020-10-04 ENCOUNTER — Emergency Department (HOSPITAL_BASED_OUTPATIENT_CLINIC_OR_DEPARTMENT_OTHER)
Admission: EM | Admit: 2020-10-04 | Discharge: 2020-10-04 | Disposition: A | Discharge status: Home / Self Care | Payer: 59 | Attending: Emergency Medicine | Admitting: Emergency Medicine

## 2020-10-04 ENCOUNTER — Encounter (HOSPITAL_BASED_OUTPATIENT_CLINIC_OR_DEPARTMENT_OTHER): Payer: Self-pay | Admitting: Emergency Medicine

## 2020-10-04 DIAGNOSIS — E11649 Type 2 diabetes mellitus with hypoglycemia without coma: Secondary | ICD-10-CM | POA: Insufficient documentation

## 2020-10-04 DIAGNOSIS — R739 Hyperglycemia, unspecified: Secondary | ICD-10-CM

## 2020-10-04 DIAGNOSIS — Z794 Long term (current) use of insulin: Secondary | ICD-10-CM | POA: Diagnosis not present

## 2020-10-04 DIAGNOSIS — W19XXXA Unspecified fall, initial encounter: Secondary | ICD-10-CM | POA: Diagnosis not present

## 2020-10-04 DIAGNOSIS — S0083XA Contusion of other part of head, initial encounter: Secondary | ICD-10-CM | POA: Insufficient documentation

## 2020-10-04 DIAGNOSIS — Z79899 Other long term (current) drug therapy: Secondary | ICD-10-CM | POA: Insufficient documentation

## 2020-10-04 DIAGNOSIS — F17210 Nicotine dependence, cigarettes, uncomplicated: Secondary | ICD-10-CM | POA: Diagnosis not present

## 2020-10-04 DIAGNOSIS — R2243 Localized swelling, mass and lump, lower limb, bilateral: Secondary | ICD-10-CM | POA: Insufficient documentation

## 2020-10-04 DIAGNOSIS — R0602 Shortness of breath: Secondary | ICD-10-CM | POA: Insufficient documentation

## 2020-10-04 DIAGNOSIS — Z20822 Contact with and (suspected) exposure to covid-19: Secondary | ICD-10-CM | POA: Insufficient documentation

## 2020-10-04 DIAGNOSIS — R519 Headache, unspecified: Secondary | ICD-10-CM | POA: Insufficient documentation

## 2020-10-04 DIAGNOSIS — R040 Epistaxis: Secondary | ICD-10-CM | POA: Diagnosis not present

## 2020-10-04 DIAGNOSIS — Z7901 Long term (current) use of anticoagulants: Secondary | ICD-10-CM | POA: Diagnosis not present

## 2020-10-04 DIAGNOSIS — S0990XA Unspecified injury of head, initial encounter: Secondary | ICD-10-CM

## 2020-10-04 DIAGNOSIS — I10 Essential (primary) hypertension: Secondary | ICD-10-CM | POA: Diagnosis not present

## 2020-10-04 DIAGNOSIS — R0789 Other chest pain: Secondary | ICD-10-CM | POA: Diagnosis not present

## 2020-10-04 LAB — CBG MONITORING, ED
Glucose-Capillary: 434 mg/dL — ABNORMAL HIGH (ref 70–99)
Glucose-Capillary: 401 mg/dL — ABNORMAL HIGH (ref 70–99)

## 2020-10-04 LAB — COMPREHENSIVE METABOLIC PANEL
ALT: 33 U/L (ref 0–44)
AST: 29 U/L (ref 15–41)
Albumin: 3.4 g/dL — ABNORMAL LOW (ref 3.5–5.0)
Alkaline Phosphatase: 108 U/L (ref 38–126)
Anion gap: 13 (ref 5–15)
BUN: 9 mg/dL (ref 6–20)
CO2: 19 mmol/L — ABNORMAL LOW (ref 22–32)
Calcium: 9.1 mg/dL (ref 8.9–10.3)
Chloride: 102 mmol/L (ref 98–111)
Creatinine, Ser: 1.21 mg/dL (ref 0.61–1.24)
GFR, Estimated: >60 mL/min (ref >60)
Glucose, Bld: 458 mg/dL — ABNORMAL HIGH (ref 70–99)
Potassium: 3.8 mmol/L (ref 3.5–5.1)
Sodium: 134 mmol/L — ABNORMAL LOW (ref 135–145)
Total Bilirubin: 0.5 mg/dL (ref 0.3–1.2)
Total Protein: 6.4 g/dL — ABNORMAL LOW (ref 6.5–8.1)

## 2020-10-04 LAB — URINALYSIS, MICROSCOPIC (REFLEX)

## 2020-10-04 LAB — RESP PANEL BY RT-PCR (FLU A&B, COVID) ARPGX2
Influenza A by PCR: NEGATIVE
Influenza B by PCR: NEGATIVE
SARS Coronavirus 2 by RT PCR: NEGATIVE

## 2020-10-04 LAB — CBC WITH DIFFERENTIAL/PLATELET
Abs Immature Granulocytes: 0.04 10*3/uL (ref 0.00–0.07)
Basophils Absolute: 0.1 10*3/uL (ref 0.0–0.1)
Basophils Relative: 1 %
Eosinophils Absolute: 0.1 10*3/uL (ref 0.0–0.5)
Eosinophils Relative: 1 %
HCT: 42.8 % (ref 39.0–52.0)
Hemoglobin: 15.3 g/dL (ref 13.0–17.0)
Immature Granulocytes: 0 %
Lymphocytes Relative: 22 %
Lymphs Abs: 2.1 10*3/uL (ref 0.7–4.0)
MCH: 31.5 pg (ref 26.0–34.0)
MCHC: 35.7 g/dL (ref 30.0–36.0)
MCV: 88.2 fL (ref 80.0–100.0)
Monocytes Absolute: 0.7 10*3/uL (ref 0.1–1.0)
Monocytes Relative: 8 %
Neutro Abs: 6.4 10*3/uL (ref 1.7–7.7)
Neutrophils Relative %: 68 %
Platelets: 261 10*3/uL (ref 150–400)
RBC: 4.85 MIL/uL (ref 4.22–5.81)
RDW: 14.6 % (ref 11.5–15.5)
WBC: 9.3 10*3/uL (ref 4.0–10.5)
nRBC: 0 % (ref 0.0–0.2)

## 2020-10-04 LAB — URINALYSIS, ROUTINE W REFLEX MICROSCOPIC
Bilirubin Urine: NEGATIVE
Glucose, UA: >=500 mg/dL — AB
Hgb urine dipstick: NEGATIVE
Ketones, ur: NEGATIVE mg/dL
Leukocytes,Ua: NEGATIVE
Nitrite: NEGATIVE
Protein, ur: NEGATIVE mg/dL
Specific Gravity, Urine: 1.01 (ref 1.005–1.030)
pH: 6 (ref 5.0–8.0)

## 2020-10-04 LAB — TROPONIN I (HIGH SENSITIVITY)
Troponin I (High Sensitivity): 6 ng/L (ref <18)
Troponin I (High Sensitivity): 6 ng/L (ref <18)

## 2020-10-04 LAB — LACTIC ACID, PLASMA
Lactic Acid, Venous: 3 mmol/L (ref 0.5–1.9)
Lactic Acid, Venous: 2.1 mmol/L (ref 0.5–1.9)

## 2020-10-04 LAB — LIPASE, BLOOD: Lipase: 45 U/L (ref 11–51)

## 2020-10-04 MED ORDER — SODIUM CHLORIDE 0.9 % IV BOLUS
1000.0000 mL | Freq: Once | INTRAVENOUS | Status: AC
  Administered 2020-10-04: 1000 mL via INTRAVENOUS

## 2020-10-04 MED ORDER — FENTANYL CITRATE (PF) 100 MCG/2ML IJ SOLN
50.0000 ug | Freq: Once | INTRAMUSCULAR | Status: AC
  Administered 2020-10-04: 50 ug via INTRAVENOUS
  Filled 2020-10-04: qty 2

## 2020-10-04 MED ORDER — MORPHINE SULFATE (PF) 4 MG/ML IV SOLN
8.0000 mg | Freq: Once | INTRAVENOUS | Status: AC
  Administered 2020-10-04: 8 mg via INTRAVENOUS
  Filled 2020-10-04: qty 2

## 2020-10-04 MED ORDER — MORPHINE SULFATE (PF) 4 MG/ML IV SOLN: 4.0000 mg | Freq: Once | INTRAVENOUS | Status: DC

## 2020-10-04 MED ORDER — SODIUM CHLORIDE 0.9 % IV BOLUS: 500.0000 mL | Freq: Once | INTRAVENOUS | Status: DC

## 2020-10-04 NOTE — ED Provider Notes (Signed)
[facility] EMERGENCY DEPARTMENT Provider Note   CSN: [id number] Arrival date & time: 10/04/20  1523     History Chief Complaint  Patient presents with  . Fall    Dan Nguyen is a 59 y.o. male.  HPI   59 year old male with a history of arthritis, asthma, BPH, COPD, depression, diabetes, dysrhythmia, fibromyalgia, GERD, hypertension, hypothyroidism, nephrolithiasis, pneumonia, shortness of breath, sleep apnea, CVA, thyroid dysfunction, Parkinson's disease, who presents the emergency department today for evaluation of a fall.  Wife states he was sleeping in his chair and that he sometimes will have flailing movements during sleep.  He started doing this during sleep and fell out of his chair landing onto his face.  He is complaining of pain to his nose, neck, head.  He is anticoagulated.  He is also complaining of pain to the right shoulder, left hip.  Patient states that he has been having some shortness of breath and chest pain recently for the last few weeks.  He has not had any significant change in his cough.  He has had no fevers at home.  Denies any vomiting, diarrhea, urinary symptoms.  Of note, patient's wife states that he has been very anxious and stressed recently due to the lost of his father-in-law earlier this week.  Patient is tearful about this.  Past Medical History:  Diagnosis Date  . Arthritis   . Asthma   . BPH (benign prostatic hyperplasia)   . Bronchitis    hx of  . Chronic back pain   . COPD (chronic obstructive pulmonary disease) ([place])   . Depression   . Diabetes mellitus    type 2  no medicatios  . Dysrhythmia    hx of a-fib  . Fibromyalgia    small fiber neuropathy  . GERD (gastroesophageal reflux disease)   . Headache(784.0)    migraines  . Hypertension   . Hypothyroidism   . Kidney stone    hx of  . Low testosterone   . Pneumonia    hx of  . Shortness of breath    no problems lately  . Sleep apnea    does not have a cpap, sees  Dr. Camillo Flaming, [facility] pulmonology  . Stroke ([place])    5_2-13 memory and speech isuses,lt.leg  . Thyroid dysfunction   . TIA (transient ischemic attack)    sees Dr. Julianne Rice with [facility] cardiology    Patient Active Problem List   Diagnosis Date Noted  . Wrist pain 07/02/2012    Past Surgical History:  Procedure Laterality Date  . CARPAL TUNNEL WITH CUBITAL TUNNEL Right 02/25/2013   Procedure: RIGHT CARPAL TUNNEL RELEASE WITH RIGHT CUBITAL TUNNEL RELEASE, LEFT WRIST INJECTION ;  Surgeon: Schuyler Amor, MD;  Location: [facility];  Service: Orthopedics;  Laterality: Right;  . CHOLECYSTECTOMY    . DUPUYTREN / PALMAR FASCIOTOMY    . FACIAL RECONSTRUCTION SURGERY    . PATENT FORAMEN OVALE CLOSURE    . RADIAL HEAD EXCISION    . WRIST ARTHROSCOPY  07/02/2012   Procedure: ARTHROSCOPY WRIST;  Surgeon: Schuyler Amor, MD;  Location: [facility];  Service: Orthopedics;  Laterality: Left;  Left Wrist Arthroscopy with Debridement/ Left Open Ulnar Shortening Osteotomy        No family history on file.  Social History   Tobacco Use  . Smoking status: Current Every Day Smoker    Packs/day: 1.50    Years: 40.00    Pack years: 60.00  Types: Cigarettes    Last attempt to quit: 05/30/2012    Years since quitting: 8.3  . Smokeless tobacco: Never Used  Substance Use Topics  . Alcohol use: No  . Drug use: No    Home Medications Prior to Admission medications   Medication Sig Start Date End Date Taking? Authorizing Provider  diclofenac Sodium (VOLTAREN) 1 % GEL To affected areas qid 06/22/15  Yes [provider]  albuterol (PROVENTIL HFA;VENTOLIN HFA) 108 (90 BASE) MCG/ACT inhaler Inhale 2 puffs into the lungs every 4 (four) hours as needed. Shortness of breath    [provider]  albuterol (PROVENTIL) (2.5 MG/3ML) 0.083% nebulizer solution Take 2.5 mg by nebulization every 6 (six) hours as needed. For shortness of breath and wheezing    [provider]  alfuzosin  (UROXATRAL) 10 MG 24 hr tablet Take 10 mg by mouth daily. 08/15/20   [provider]  atenolol (TENORMIN) 50 MG tablet Take 50 mg by mouth daily.     [provider]  atorvastatin (LIPITOR) 40 MG tablet Take 40 mg by mouth at bedtime. 04/28/20   [provider]  budesonide-formoterol (SYMBICORT) 80-4.5 MCG/ACT inhaler Inhale 2 puffs into the lungs 2 (two) times daily.    [provider]  Cholecalciferol (VITAMIN D3) 5000 UNITS CAPS Take 1 tablet by mouth 2 (two) times daily.    [provider]  dabigatran (PRADAXA) 150 MG CAPS Take 150 mg by mouth every 12 (twelve) hours.    [provider]  DEXILANT 60 MG capsule Take 1 capsule by mouth daily. 09/26/20   [provider]  dofetilide (TIKOSYN) 500 MCG capsule Take 500 mcg by mouth 2 (two) times daily. 08/10/20   [provider]  DULoxetine (CYMBALTA) 60 MG capsule Take 60 mg by mouth daily.    [provider]  ELIQUIS 5 MG TABS tablet Take 5 mg by mouth 2 (two) times daily. 09/11/20   [provider]  EPINEPHrine (EPI-PEN) 0.3 mg/0.3 mL DEVI Inject 0.3 mg into the muscle as needed. For allergic reactions/anaphylaxis    [provider]  ezetimibe (ZETIA) 10 MG tablet Take 10 mg by mouth daily.    [provider]  furosemide (LASIX) 20 MG tablet Take 10-20 mg by mouth daily.    [provider]  gabapentin (NEURONTIN) 800 MG tablet Take 800 mg by mouth 2 (two) times daily.     [provider]  HYDROmorphone (DILAUDID) 2 MG tablet Take 1 tablet (2 mg total) by mouth every 4 (four) hours as needed for pain. 02/25/13   Charlotte Crumb, MD  irbesartan (AVAPRO) 150 MG tablet Take 150 mg by mouth daily. 07/07/20   [provider]  levothyroxine (SYNTHROID, LEVOTHROID) 25 MCG tablet Take 25 mcg by mouth daily.    [provider]  losartan (COZAAR) 50 MG tablet Take 50 mg by mouth daily.    [provider]   methocarbamol (ROBAXIN) 750 MG tablet Take 750 mg by mouth 3 (three) times daily.    [provider]  metoprolol succinate (TOPROL-XL) 50 MG 24 hr tablet Take 50 mg by mouth 2 (two) times daily. 04/28/20   [provider]  morphine (KADIAN) 60 MG 24 hr capsule Take 60 mg by mouth 3 (three) times daily.    [provider]  NARCAN 4 MG/0.1ML LIQD nasal spray kit SMARTSIG:Both Nares 05/09/20   [provider]  ondansetron (ZOFRAN) 4 MG tablet Take 4 mg by mouth every 8 (  eight) hours as needed. 05/16/20   [provider]  oxyCODONE-acetaminophen (PERCOCET) 10-325 MG per tablet Take 1 tablet by mouth every 4 (four) hours as needed. For pain    [provider]  pantoprazole (PROTONIX) 40 MG tablet Take 40 mg by mouth 2 (two) times daily.     [provider]  pramipexole (MIRAPEX) 1 MG tablet  08/10/20   [provider]  pregabalin (LYRICA) 300 MG capsule Take 300 mg by mouth 2 (two) times daily. 09/18/20   [provider]  PRESCRIPTION MEDICATION Apply 1 application topically 2 (two) times daily. Triamcinolone/Cetaphil 0.1% 50/50 mix    [provider]  primidone (MYSOLINE) 50 MG tablet Take by mouth. 05/30/20   [provider]  promethazine (PHENERGAN) 25 MG tablet Take 25 mg by mouth every 6 (six) hours as needed. For nausea    [provider]  REXULTI 0.5 MG TABS Take 1 tablet by mouth daily. 09/07/20   [provider]  sildenafil (VIAGRA) 100 MG tablet Take 100 mg by mouth daily as needed for erectile dysfunction.    [provider]  Tamsulosin HCl (FLOMAX) 0.4 MG CAPS Take 0.4 mg by mouth daily after supper.     [provider]  TESTOSTERONE CYPIONATE IM Inject 200 mg into the muscle every 14 (fourteen) days.    [provider]  tiZANidine (ZANAFLEX) 4 MG tablet Take 4 mg by mouth 2 (two) times daily. 09/12/20   [provider]  topiramate (TOPAMAX) 200 MG  tablet Take 200 mg by mouth 2 (two) times daily. 07/29/20   [provider]  TRESIBA FLEXTOUCH 100 UNIT/ML FlexTouch Pen Inject into the skin. 09/13/20   [provider]  TRIJARDY XR 12.5-2.03-999 MG TB24 Take by mouth. 09/05/20   [provider]  XTAMPZA ER 18 MG C12A Take 1 capsule by mouth 2 (two) times daily. 09/18/20   [provider]    Allergies    Bee venom, Darvocet [propoxyphene n-acetaminophen], Penicillins, Darvon, and Prednisone  Review of Systems   Review of Systems  Constitutional: Negative for chills and fever.  HENT: Positive for nosebleeds (currently resolved). Negative for ear pain and sore throat.        Nose pain  Eyes: Negative for visual disturbance.  Respiratory: Positive for cough and shortness of breath.   Cardiovascular: Positive for chest pain and leg swelling (chronic, unchanged).  Gastrointestinal: Negative for abdominal pain and vomiting.  Genitourinary: Negative for dysuria and hematuria.  Musculoskeletal: Negative for back pain.  Skin: Negative for rash.  Neurological: Positive for headaches. Negative for seizures and syncope.       Head injury, no loc.   All other systems reviewed and are negative.   Physical Exam Updated Vital Signs BP 136/83   Pulse (!) 112   Temp 99.4 F (37.4 C) (Rectal)   Resp 16   Ht 5' 9"  (1.753 m)   Wt 105.2 kg   SpO2 97%   BMI 34.26 kg/m   Physical Exam Vitals and nursing note reviewed.  Constitutional:      Appearance: He is well-developed.  HENT:     Head: Normocephalic.     Comments: TTP to the nasal bridge and to the right forehead Eyes:     Conjunctiva/sclera: Conjunctivae normal.  Cardiovascular:     Rate and Rhythm: Regular rhythm. Tachycardia present.     Heart sounds: Normal heart sounds. No murmur heard.   Pulmonary:     Effort: Pulmonary  effort is normal. No respiratory distress.     Breath sounds: Normal breath sounds. No wheezing, rhonchi or rales.   Abdominal:     General: Bowel sounds are normal.     Palpations: Abdomen is soft.     Tenderness: There is no abdominal tenderness. There is no guarding or rebound.  Musculoskeletal:     Cervical back: Neck supple.     Comments: Trace ble edema, no calf TTP. TTP to the cervical spine, no thoracic or lumbar spine TTP. TTP to the left hip and pain with ROM. TTP to the right shoulder as well.   Skin:    General: Skin is warm and dry.  Neurological:     Mental Status: He is alert.     Comments: Mental Status:  Alert, thought content appropriate, able to give a coherent history. Speech fluent without evidence of aphasia. Able to follow 2 step commands without difficulty.  Cranial Nerves:  II: pupils equal, round, reactive to light III,IV, VI: ptosis not present, extra-ocular motions intact bilaterally  V,VII: smile symmetric, facial light touch sensation equal VIII: hearing grossly normal to voice  X: uvula elevates symmetrically  XI: bilateral shoulder shrug symmetric and strong XII: midline tongue extension without fassiculations Motor:  Normal tone. Decreased strength to the LUE/LLE which is chronic from prior CVA Sensory: decreased sensation to the LUE/LLE (chronic, residual from prior CVA)      ED Results / Procedures / Treatments   Labs (all labs ordered are listed, but only abnormal results are displayed) Labs Reviewed  LACTIC ACID, PLASMA - Abnormal; Notable for the following components:      Result Value   Lactic Acid, Venous 3.0 (*)    All other components within normal limits  URINALYSIS, ROUTINE W REFLEX MICROSCOPIC - Abnormal; Notable for the following components:   Glucose, UA >=500 (*)    All other components within normal limits  URINALYSIS, MICROSCOPIC (REFLEX) - Abnormal; Notable for the following components:   Bacteria, UA RARE (*)    All other components within normal limits  CBG MONITORING, ED - Abnormal; Notable for the following components:    Glucose-Capillary 434 (*)    All other components within normal limits  RESP PANEL BY RT-PCR (FLU A&B, COVID) ARPGX2  CBC WITH DIFFERENTIAL/PLATELET  COMPREHENSIVE METABOLIC PANEL  LACTIC ACID, PLASMA  LIPASE, BLOOD  TROPONIN I (HIGH SENSITIVITY)  TROPONIN I (HIGH SENSITIVITY)    EKG EKG Interpretation  Date/Time:  Tuesday October 04 2020 16:00:27 EST Ventricular Rate:  122 PR Interval:    QRS Duration: 85 QT Interval:  329 QTC Calculation: 469 R Axis:   40 Text Interpretation: Sinus tachycardia Abnormal R-wave progression, early transition Confirmed by Lennice Sites [phone]) on 10/04/2020 4:03:10 PM   Radiology DG Chest 2 View  Result Date: 10/04/2020 CLINICAL DATA:  Fall. EXAM: CHEST - 2 VIEW COMPARISON:  None. FINDINGS: The heart size and mediastinal contours are within normal limits. Both lungs are clear. The visualized skeletal structures are unremarkable. IMPRESSION: No active cardiopulmonary disease. Electronically Signed   By: Marijo Conception M.D.   On: 10/04/2020 18:50   DG Shoulder Right  Result Date: 10/04/2020 CLINICAL DATA:  59 year old male with fall and trauma to the right shoulder. EXAM: RIGHT SHOULDER - 2+ VIEW COMPARISON:  None. FINDINGS: There is no acute fracture or dislocation. The bones are osteopenic. Mild arthritic changes with mild narrowing of the inferior glenohumeral joint space. The soft tissues are unremarkable. IMPRESSION: No acute fracture or  dislocation. Electronically Signed   By: Anner Crete M.D.   On: 10/04/2020 18:48   CT Head Wo Contrast  Result Date: 10/04/2020 CLINICAL DATA:  Parkinson's disease, fell from chair, frontal pain EXAM: CT HEAD WITHOUT CONTRAST TECHNIQUE: Contiguous axial images were obtained from the base of the skull through the vertex without intravenous contrast. COMPARISON:  None. FINDINGS: Brain: No acute infarct or hemorrhage. 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 The lipoma extends along the entirety of the corpus callosum. The lateral ventricles and remaining midline structures are unremarkable. No acute extra-axial fluid collections. No mass effect. Vascular: No hyperdense vessel or unexpected calcification. Skull: Postsurgical changes are seen throughout the frontal bones of the calvarium, through the frontal sinuses, and within the nasal bone. There are no acute displaced fractures. Sinuses/Orbits: Minimal mucosal thickening within the right frontal sinus and anterior ethmoid air cells. Other: None. IMPRESSION: 1. No acute intracranial process. 2. 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 Electronically Signed   By: Randa Ngo M.D.   On: 10/04/2020 18:20   CT Cervical Spine Wo Contrast  Result Date: 10/04/2020 CLINICAL DATA:  [place] from chair, right shoulder and neck pain, nausea and dizziness EXAM: CT CERVICAL SPINE WITHOUT CONTRAST TECHNIQUE: Multidetector CT imaging of the cervical spine was performed without intravenous contrast. Multiplanar CT image reconstructions were also generated. COMPARISON:  None. FINDINGS: Alignment: Alignment is anatomic. Skull base and vertebrae: No acute fracture. No primary bone lesion or focal pathologic process. Soft tissues and spinal canal: No prevertebral fluid or swelling. No visible canal hematoma. Disc levels: Bony fusion across the C3/C4 disc space. Anterior fusions plate and screws as well as disc spacer spanning C4 through C6. Disc spacer spans the C6/C7 disc space. Alignment is anatomic. Bilateral symmetrical neural foraminal encroachment is seen at C3/C4. Mild left-sided neural foraminal narrowing is seen at C5/C6, with right predominant narrowing at C6/C7. Upper chest: Airway is patent.  Lung apices are clear. Other: Reconstructed images demonstrate no additional findings. IMPRESSION: 1. No acute cervical spine fracture. 2. Multilevel postsurgical  and degenerative changes as above. Electronically Signed   By: Randa Ngo M.D.   On: 10/04/2020 18:22   DG Hip Unilat W or Wo Pelvis 2-3 Views Left  Result Date: 10/04/2020 CLINICAL DATA:  Left hip pain after fall. EXAM: DG HIP (WITH OR WITHOUT PELVIS) 2-3V LEFT COMPARISON:  None. FINDINGS: There is no evidence of hip fracture or dislocation. No joint space narrowing is noted. Mild osteophyte formation is seen involving the left hip. IMPRESSION: Mild degenerative joint disease of the left hip. No acute abnormality is noted. Electronically Signed   By: Marijo Conception M.D.   On: 10/04/2020 18:49   CT Maxillofacial Wo Contrast  Result Date: 10/04/2020 CLINICAL DATA:  [place] out of chair, frontal head pain, nasal pain EXAM: CT MAXILLOFACIAL WITHOUT CONTRAST TECHNIQUE: Multidetector CT imaging of the maxillofacial structures was performed. Multiplanar CT image reconstructions were also generated. COMPARISON:  None. FINDINGS: Osseous: There are no acute displaced fractures. Postsurgical changes are seen at the right zygomatic arch, right orbit, right frontal sinus, and nasal bone. Orbits: Globes are intact. Curvilinear metallic densities within the superomedial aspects of each orbit are likely postsurgical. Sinuses: Mild mucosal thickening of the anterior ethmoid air cells and right frontal sinus. No gas fluid levels. Soft tissues: Soft tissues are grossly unremarkable. Limited intracranial: Please refer to CT head report  describing pericallosal lipoma. IMPRESSION: 1. Extensive postsurgical changes of the facial bones. No acute fractures. Electronically Signed   By: Randa Ngo M.D.   On: 10/04/2020 18:29    Procedures Procedures (including critical care time)  Medications Ordered in ED Medications  morphine 4 MG/ML injection 8 mg (has no administration in time range)  fentaNYL (SUBLIMAZE) injection 50 mcg (50 mcg Intravenous Given 10/04/20 1705)  sodium chloride 0.9 % bolus 1,000 mL (0 mLs  Intravenous Stopped 10/04/20 1846)  sodium chloride 0.9 % bolus 1,000 mL (1,000 mLs Intravenous New Bag/Given 10/04/20 1933)    ED Course  I have reviewed the triage vital signs and the nursing notes.  Pertinent labs & imaging results that were available during my care of the patient were reviewed by me and considered in my medical decision making (see chart for details).    MDM Rules/Calculators/A&P                          59 year old male presenting the emergency department today for evaluation after a fall.  Is anticoagulated.  Did sustain head trauma.  Also complaining of right shoulder pain, left hip pain and chest pain/shortness of breath for the last few weeks.  He is currently anticoagulated.  Reviewed/interpreted labs CBC is unremarkable CMP pending at shift change Lipase pending at shift change Troponin is negative UA with glucosuria, no leukocytes, nitrites to suggest UTI no ketones to suggest DKA. Lactic acid elevated to 3 Covid neg  EKG - Sinus tachycardia Abnormal R-wave progression, early transition   Reviewed/interpreted imaging CT head - 1. No acute intracranial process. 2. 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 CT cervical spine - 1. No acute cervical spine fracture. 2. Multilevel postsurgical and degenerative changes as above. CT maxillofacial - 1. Extensive postsurgical changes of the facial bones. No acute fractures. Xray right shoulder - neg CXR - unremarkable Xray left hip - neg  At shift change, pt pending cmp, lipase, repeat lactic and repeat trop. Care transitioned to Dr. Ronnald Nian to f/u on pending studies. Likely plan for discharge pending values.   Final Clinical Impression(s) / ED Diagnoses Final diagnoses:  Fall, initial encounter  Injury of head, initial encounter  Hyperglycemia    Rx / DC Orders ED Discharge Orders    None       Rodney Booze, PA-C 10/04/20 [place], [place], DO 10/04/20 2057

## 2020-10-04 NOTE — ED Notes (Signed)
Foam C-collar removed per [place], C-collar placed during assessment.

## 2020-10-04 NOTE — ED Notes (Signed)
Date and time results received: 10/04/20 2019  Test: lactic Critical Value: 2.1  Name of Provider Notified: Dr. Ronnald Nian  Orders Received? Or Actions Taken?: no new orders

## 2020-10-04 NOTE — ED Triage Notes (Signed)
Pt arrives pov with wife with c/o fall from recliner. Pt endorses hx of parkinsons. Pt c/o central forehead pain, nose pain, R shoulder pain, and neck pain. Pt also c/o left finger numbness and nausea with dizziness.

## 2020-10-04 NOTE — Discharge Instructions (Addendum)
Please continue your chronic pain medications at home to help with the pain from your fall.   In regards to your elevated blood sugar, you need to follow up with your regular doctor to see if your diabetes medications need to be adjusted.   Please follow up with your primary care provider within 3-5 days for re-evaluation of your symptoms. If you do not have a primary care provider, information for a healthcare clinic has been provided for you to make arrangements for follow up care. Please return to the emergency department for any new or worsening symptoms.

## 2021-05-30 IMAGING — CT CT MAXILLOFACIAL W/O CM
3 series · 16 of 47 positions shown, 19 images · non-contrast
Comparison: None.

CLINICAL DATA: Fell out of chair, frontal head pain, nasal pain

EXAM:
CT MAXILLOFACIAL WITHOUT CONTRAST
TECHNIQUE: Multidetector CT imaging of the maxillofacial structures was
performed. Multiplanar CT image reconstructions were also generated.

[Series 2: max soft · axial · 0.40mm/px · z∈[+874,+1000]mm · 10 of 75 slices shown, 13 images]
[im 6/75  brain]
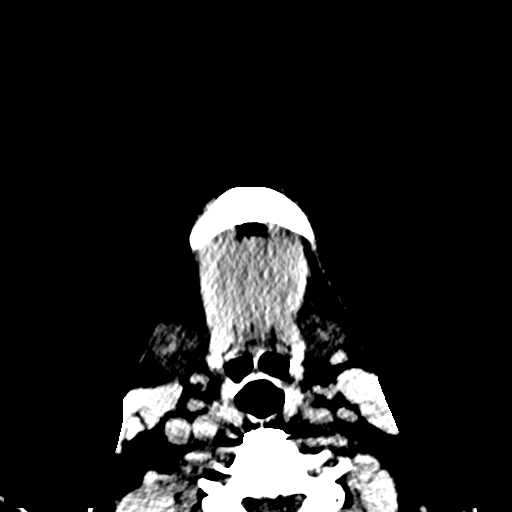
[im 6/75  bone]
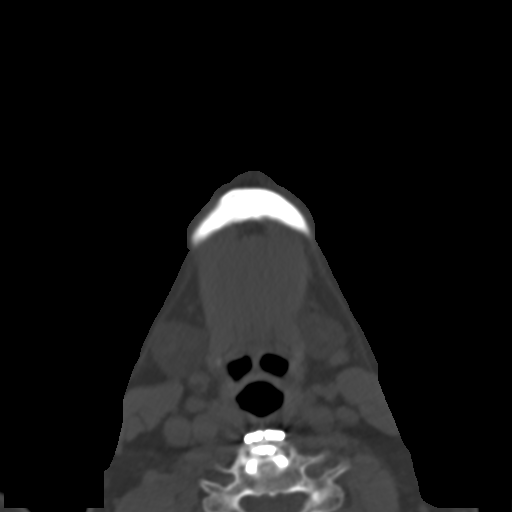
[im 13/75  bone]
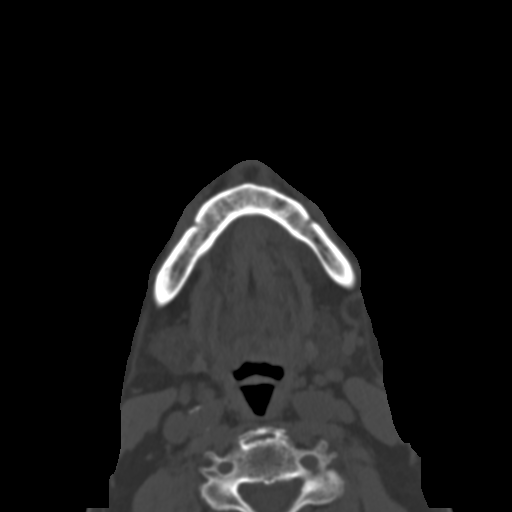
[im 21/75  bone]
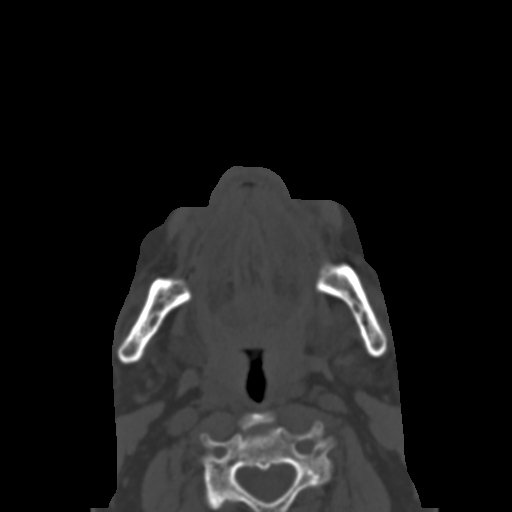
[im 26/75  bone]
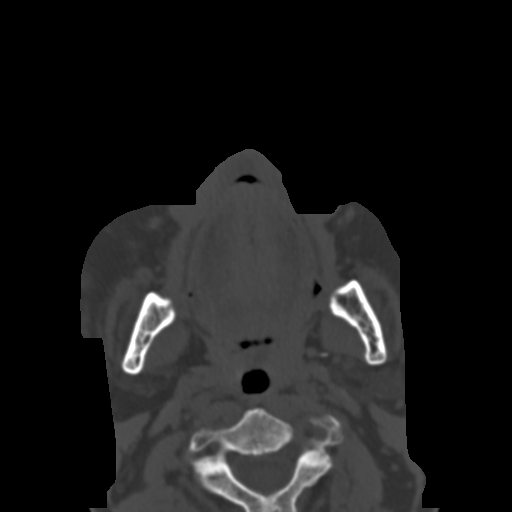
[im 34/75  brain]
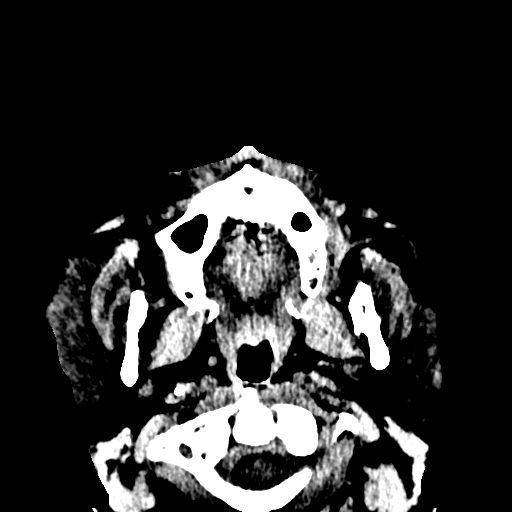
[im 34/75  bone]
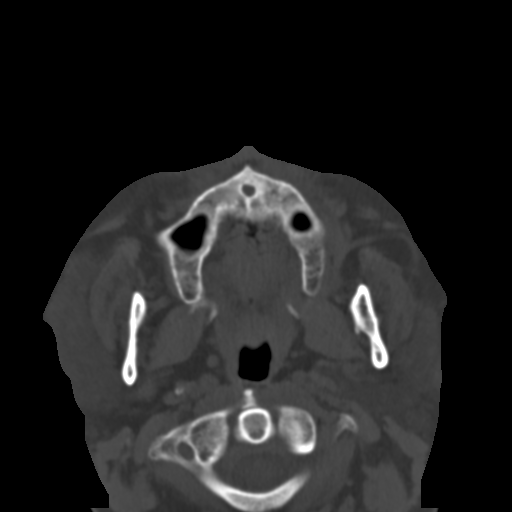
[im 41/75  bone]
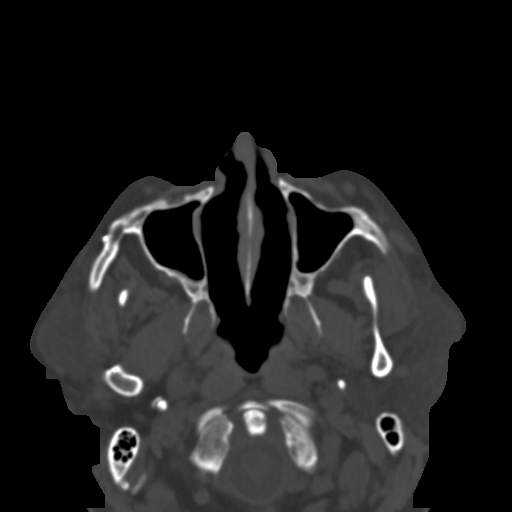
[im 49/75  bone]
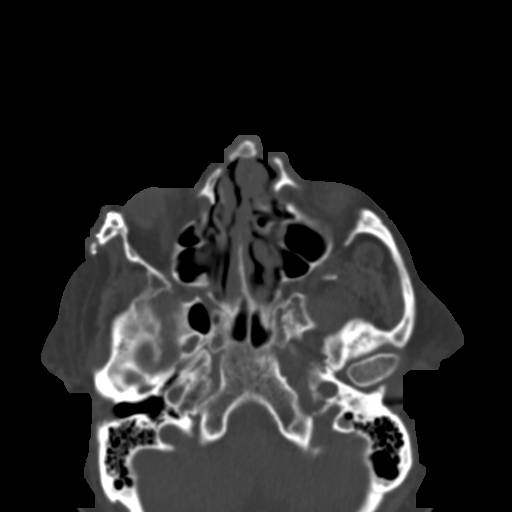
[im 57/75  bone]
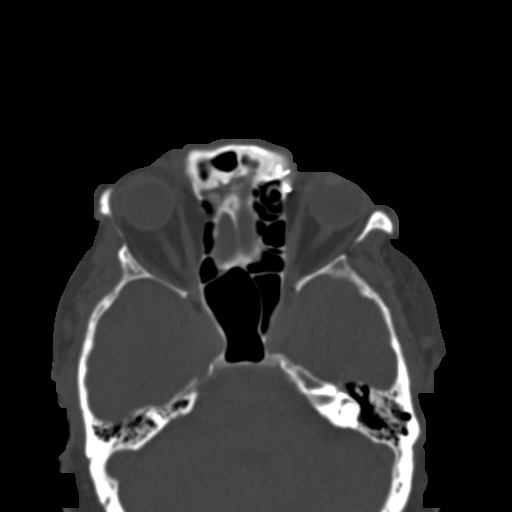
[im 62/75  brain]
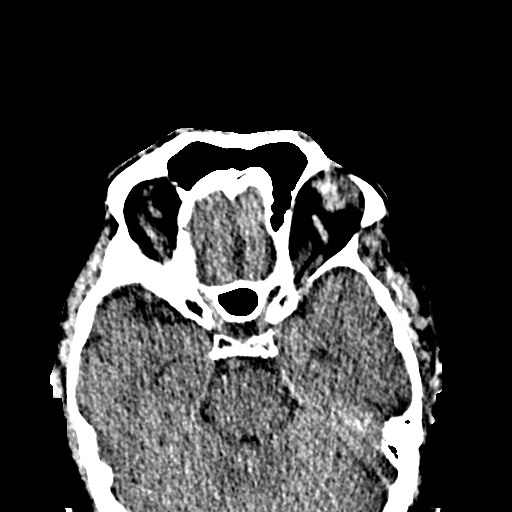
[im 62/75  bone]
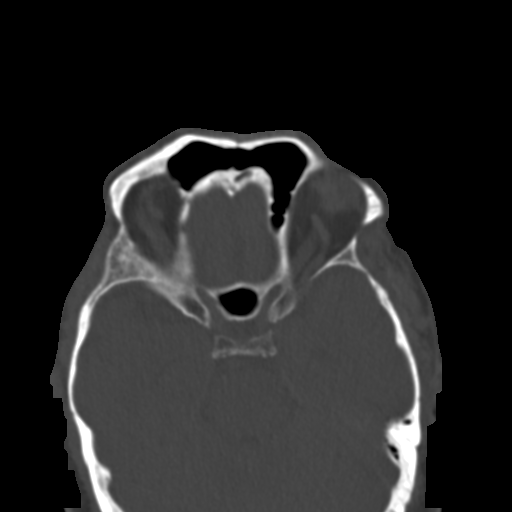
[im 69/75  bone]
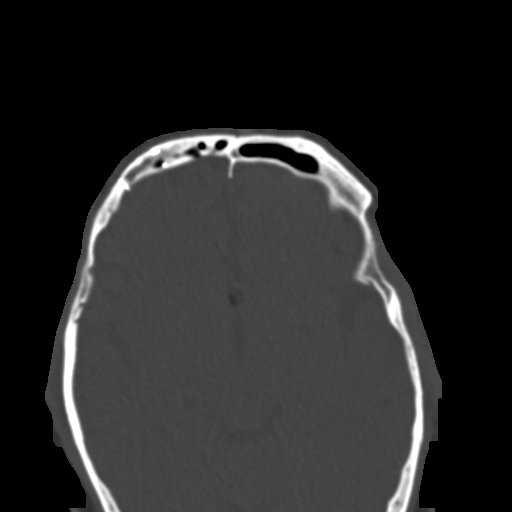

[Series 6: coronal soft · coronal · 0.31mm/px · 3 of 82 slices shown]
[im 28/82  bone]
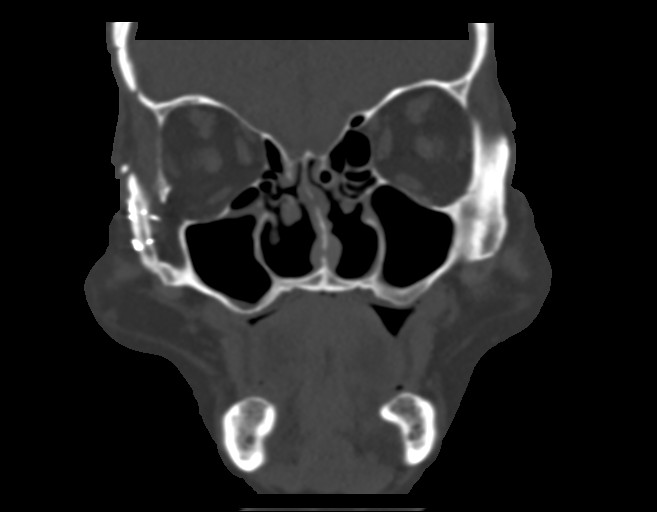
[im 37/82  bone]
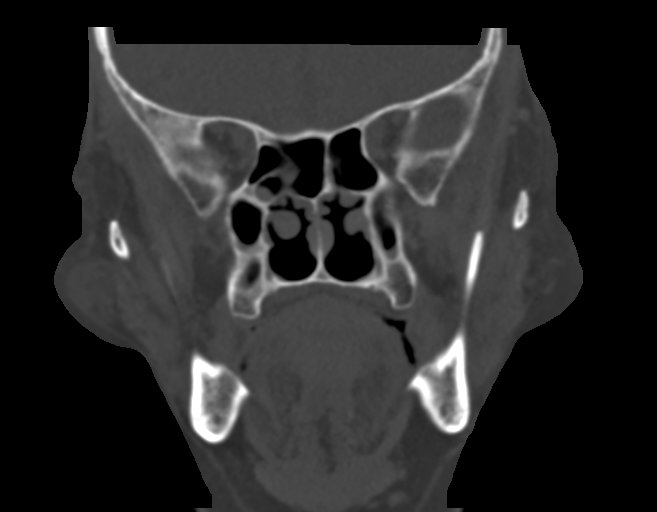
[im 46/82  bone]
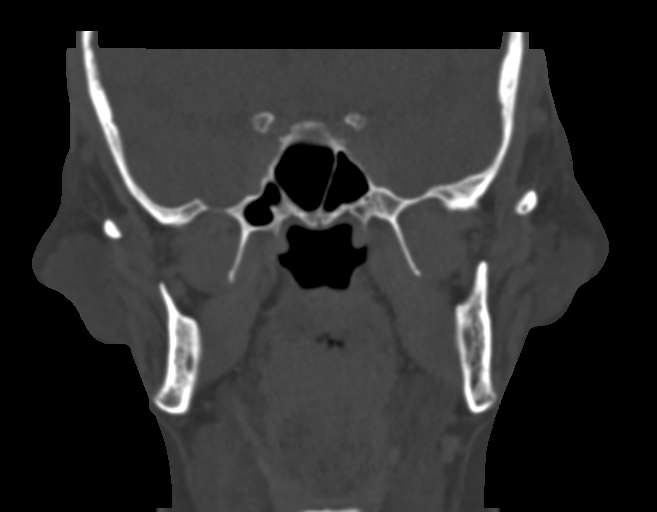

[Series 7: sagittal soft · sagittal · 0.31mm/px · 3 of 103 slices shown]
[im 35/103  bone]
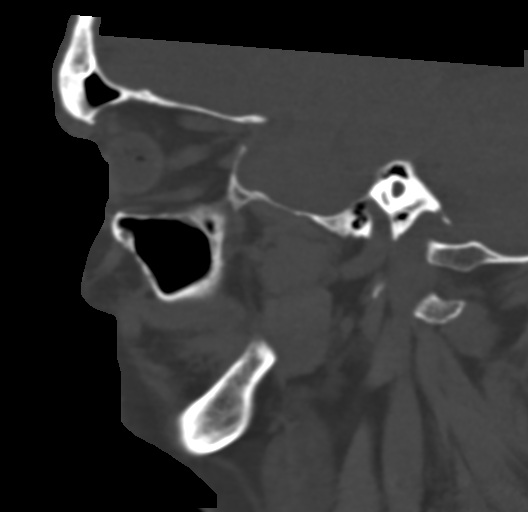
[im 52/103  bone]
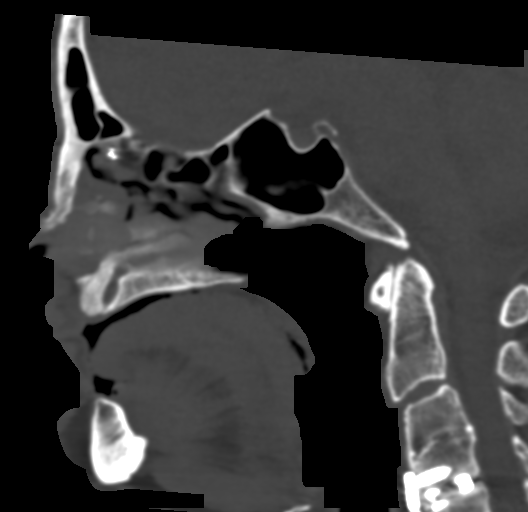
[im 69/103  bone]
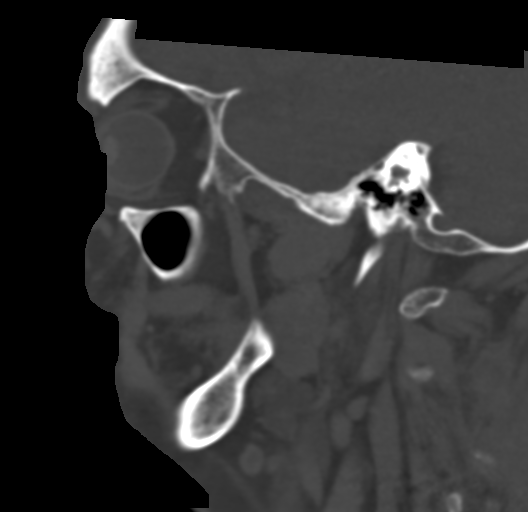

[16 of 47 positions shown; findings below may reference images not displayed]

FINDINGS: Osseous: There are no acute displaced fractures. Postsurgical
changes are seen at the right zygomatic arch, right orbit, right
frontal sinus, and nasal bone.

Orbits: Globes are intact. Curvilinear metallic densities within the
superomedial aspects of each orbit are likely postsurgical.

Sinuses: Mild mucosal thickening of the anterior ethmoid air cells
and right frontal sinus. No gas fluid levels.

Soft tissues: Soft tissues are grossly unremarkable.

Limited intracranial: Please refer to CT head report describing
pericallosal lipoma.
IMPRESSION: 1. Extensive postsurgical changes of the facial bones. No acute
fractures.
